# Patient Record
Sex: Male | Born: 1955
Health system: Southern US, Community
[De-identification: ages and names within clinical notes are randomized; demographics above are authoritative.]

## PROBLEM LIST (undated history)

## (undated) DIAGNOSIS — I10 Essential (primary) hypertension: Secondary | ICD-10-CM

## (undated) DIAGNOSIS — J449 Chronic obstructive pulmonary disease, unspecified: Secondary | ICD-10-CM

## (undated) DIAGNOSIS — G4733 Obstructive sleep apnea (adult) (pediatric): Secondary | ICD-10-CM

## (undated) DIAGNOSIS — H269 Unspecified cataract: Secondary | ICD-10-CM

## (undated) DIAGNOSIS — I4892 Unspecified atrial flutter: Secondary | ICD-10-CM

## (undated) HISTORY — PX: IR FL GUIDED LOC OF NEEDLE/CATH TIP FOR SPINAL INJECTION LT: IMG2396

## (undated) HISTORY — DX: Unspecified atrial flutter: I48.92

## (undated) HISTORY — DX: Obstructive sleep apnea (adult) (pediatric): G47.33

## (undated) HISTORY — PX: CARPAL TUNNEL RELEASE: SHX101

## (undated) HISTORY — DX: Essential (primary) hypertension: I10

## (undated) HISTORY — DX: Unspecified cataract: H26.9

## (undated) HISTORY — PX: EYE SURGERY: SHX253

## (undated) HISTORY — PX: HEEL SPUR SURGERY: SHX665

## (undated) HISTORY — DX: Chronic obstructive pulmonary disease, unspecified: J44.9

---

## 1992-07-18 HISTORY — PX: ORBITAL RECONSTRUCTION: SHX2115

## 1998-07-18 HISTORY — PX: OTHER SURGICAL HISTORY: SHX169

## 2011-07-19 HISTORY — PX: OTHER SURGICAL HISTORY: SHX169

## 2012-01-31 DIAGNOSIS — I8393 Asymptomatic varicose veins of bilateral lower extremities: Secondary | ICD-10-CM | POA: Insufficient documentation

## 2014-07-24 DIAGNOSIS — E785 Hyperlipidemia, unspecified: Secondary | ICD-10-CM | POA: Insufficient documentation

## 2016-07-18 HISTORY — PX: ROTATOR CUFF REPAIR: SHX139

## 2017-04-06 DIAGNOSIS — M48061 Spinal stenosis, lumbar region without neurogenic claudication: Secondary | ICD-10-CM | POA: Insufficient documentation

## 2018-10-24 DIAGNOSIS — J449 Chronic obstructive pulmonary disease, unspecified: Secondary | ICD-10-CM | POA: Insufficient documentation

## 2019-12-30 ENCOUNTER — Encounter: Payer: Self-pay | Admitting: Physician Assistant

## 2019-12-30 ENCOUNTER — Other Ambulatory Visit: Payer: Self-pay

## 2019-12-30 ENCOUNTER — Ambulatory Visit (INDEPENDENT_AMBULATORY_CARE_PROVIDER_SITE_OTHER): Payer: 59 | Admitting: Physician Assistant

## 2019-12-30 VITALS — BP 146/82 | HR 80 | Temp 98.6°F | Ht 73.0 in | Wt 233.2 lb

## 2019-12-30 DIAGNOSIS — E785 Hyperlipidemia, unspecified: Secondary | ICD-10-CM | POA: Diagnosis not present

## 2019-12-30 DIAGNOSIS — Z125 Encounter for screening for malignant neoplasm of prostate: Secondary | ICD-10-CM

## 2019-12-30 DIAGNOSIS — I10 Essential (primary) hypertension: Secondary | ICD-10-CM | POA: Diagnosis not present

## 2019-12-30 DIAGNOSIS — Z0001 Encounter for general adult medical examination with abnormal findings: Secondary | ICD-10-CM | POA: Diagnosis not present

## 2019-12-30 DIAGNOSIS — M48061 Spinal stenosis, lumbar region without neurogenic claudication: Secondary | ICD-10-CM | POA: Diagnosis not present

## 2019-12-30 DIAGNOSIS — Z1159 Encounter for screening for other viral diseases: Secondary | ICD-10-CM

## 2019-12-30 DIAGNOSIS — J42 Unspecified chronic bronchitis: Secondary | ICD-10-CM

## 2019-12-30 DIAGNOSIS — Z0000 Encounter for general adult medical examination without abnormal findings: Secondary | ICD-10-CM

## 2019-12-30 LAB — COMPREHENSIVE METABOLIC PANEL
ALT: 24 U/L (ref 0–53)
AST: 25 U/L (ref 0–37)
Albumin: 4.6 g/dL (ref 3.5–5.2)
Alkaline Phosphatase: 55 U/L (ref 39–117)
BUN: 18 mg/dL (ref 6–23)
CO2: 27 mEq/L (ref 19–32)
Calcium: 9.3 mg/dL (ref 8.4–10.5)
Chloride: 103 mEq/L (ref 96–112)
Creatinine, Ser: 0.95 mg/dL (ref 0.40–1.50)
GFR: 79.71 mL/min (ref >60.00)
Glucose, Bld: 90 mg/dL (ref 70–99)
Potassium: 4.6 mEq/L (ref 3.5–5.1)
Sodium: 139 mEq/L (ref 135–145)
Total Bilirubin: 0.9 mg/dL (ref 0.2–1.2)
Total Protein: 7.2 g/dL (ref 6.0–8.3)

## 2019-12-30 LAB — CBC WITH DIFFERENTIAL/PLATELET
Basophils Absolute: 0.1 10*3/uL (ref 0.0–0.1)
Basophils Relative: 1.2 % (ref 0.0–3.0)
Eosinophils Absolute: 0.1 10*3/uL (ref 0.0–0.7)
Eosinophils Relative: 1.6 % (ref 0.0–5.0)
HCT: 42.4 % (ref 39.0–52.0)
Hemoglobin: 14.9 g/dL (ref 13.0–17.0)
Lymphocytes Relative: 19.4 % (ref 12.0–46.0)
Lymphs Abs: 1 10*3/uL (ref 0.7–4.0)
MCHC: 35 g/dL (ref 30.0–36.0)
MCV: 95.7 fl (ref 78.0–100.0)
Monocytes Absolute: 0.6 10*3/uL (ref 0.1–1.0)
Monocytes Relative: 11.5 % (ref 3.0–12.0)
Neutro Abs: 3.4 10*3/uL (ref 1.4–7.7)
Neutrophils Relative %: 66.3 % (ref 43.0–77.0)
Platelets: 188 10*3/uL (ref 150.0–400.0)
RBC: 4.44 Mil/uL (ref 4.22–5.81)
RDW: 13.3 % (ref 11.5–15.5)
WBC: 5.1 10*3/uL (ref 4.0–10.5)

## 2019-12-30 LAB — LIPID PANEL
Cholesterol: 168 mg/dL (ref 0–200)
HDL: 69 mg/dL (ref >39.00)
LDL Cholesterol: 86 mg/dL (ref 0–99)
NonHDL: 99.28
Total CHOL/HDL Ratio: 2
Triglycerides: 65 mg/dL (ref 0.0–149.0)
VLDL: 13 mg/dL (ref 0.0–40.0)

## 2019-12-30 LAB — TSH: TSH: 1.06 u[IU]/mL (ref 0.35–4.50)

## 2019-12-30 LAB — PSA: PSA: 1.7 ng/mL (ref 0.10–4.00)

## 2019-12-30 LAB — HEMOGLOBIN A1C: Hgb A1c MFr Bld: 5.5 % (ref 4.6–6.5)

## 2019-12-30 MED ORDER — SULINDAC 150 MG PO TABS: 150.0000 mg | ORAL_TABLET | Freq: Every day | ORAL | 0 refills | Status: DC

## 2019-12-30 NOTE — Patient Instructions (Signed)
Please go to the lab for blood work.   Our office will call you with your results unless you have chosen to receive results via MyChart.  If your blood work is normal we will follow-up each year for physicals and as scheduled for chronic medical problems.  If anything is abnormal we will treat accordingly and get you in for a follow-up.  Switch the Naproxen to the Sulindac once daily. Keep up with chiropractor appointments. If not improving further I will want to get you in with a local neurosurgeon. Follow-up in 3-4 weeks for reassessment.    Preventive Care 16-28 Years Old, Male Preventive care refers to lifestyle choices and visits with your health care provider that can promote health and wellness. This includes:  A yearly physical exam. This is also called an annual well check.  Regular dental and eye exams.  Immunizations.  Screening for certain conditions.  Healthy lifestyle choices, such as eating a healthy diet, getting regular exercise, not using drugs or products that contain nicotine and tobacco, and limiting alcohol use. What can I expect for my preventive care visit? Physical exam Your health care provider will check:  Height and weight. These may be used to calculate body mass index (BMI), which is a measurement that tells if you are at a healthy weight.  Heart rate and blood pressure.  Your skin for abnormal spots. Counseling Your health care provider may ask you questions about:  Alcohol, tobacco, and drug use.  Emotional well-being.  Home and relationship well-being.  Sexual activity.  Eating habits.  Work and work Astronomer. What immunizations do I need?  Influenza (flu) vaccine  This is recommended every year. Tetanus, diphtheria, and pertussis (Tdap) vaccine  You may need a Td booster every 10 years. Varicella (chickenpox) vaccine  You may need this vaccine if you have not already been vaccinated. Zoster (shingles) vaccine  You may  need this after age 35. Measles, mumps, and rubella (MMR) vaccine  You may need at least one dose of MMR if you were born in 1957 or later. You may also need a second dose. Pneumococcal conjugate (PCV13) vaccine  You may need this if you have certain conditions and were not previously vaccinated. Pneumococcal polysaccharide (PPSV23) vaccine  You may need one or two doses if you smoke cigarettes or if you have certain conditions. Meningococcal conjugate (MenACWY) vaccine  You may need this if you have certain conditions. Hepatitis A vaccine  You may need this if you have certain conditions or if you travel or work in places where you may be exposed to hepatitis A. Hepatitis B vaccine  You may need this if you have certain conditions or if you travel or work in places where you may be exposed to hepatitis B. Haemophilus influenzae type b (Hib) vaccine  You may need this if you have certain risk factors. Human papillomavirus (HPV) vaccine  If recommended by your health care provider, you may need three doses over 6 months. You may receive vaccines as individual doses or as more than one vaccine together in one shot (combination vaccines). Talk with your health care provider about the risks and benefits of combination vaccines. What tests do I need? Blood tests  Lipid and cholesterol levels. These may be checked every 5 years, or more frequently if you are over 41 years old.  Hepatitis C test.  Hepatitis B test. Screening  Lung cancer screening. You may have this screening every year starting at age 23 if  you have a 30-pack-year history of smoking and currently smoke or have quit within the past 15 years.  Prostate cancer screening. Recommendations will vary depending on your family history and other risks.  Colorectal cancer screening. All adults should have this screening starting at age 76 and continuing until age 37. Your health care provider may recommend screening at age 29  if you are at increased risk. You will have tests every 1-10 years, depending on your results and the type of screening test.  Diabetes screening. This is done by checking your blood sugar (glucose) after you have not eaten for a while (fasting). You may have this done every 1-3 years.  Sexually transmitted disease (STD) testing. Follow these instructions at home: Eating and drinking  Eat a diet that includes fresh fruits and vegetables, whole grains, lean protein, and low-fat dairy products.  Take vitamin and mineral supplements as recommended by your health care provider.  Do not drink alcohol if your health care provider tells you not to drink.  If you drink alcohol: ? Limit how much you have to 0-2 drinks a day. ? Be aware of how much alcohol is in your drink. In the [place] one drink equals one 12 oz bottle of beer (355 mL), one 5 oz glass of wine (148 mL), or one 1 oz glass of hard liquor (44 mL). Lifestyle  Take daily care of your teeth and gums.  Stay active. Exercise for at least 30 minutes on 5 or more days each week.  Do not use any products that contain nicotine or tobacco, such as cigarettes, e-cigarettes, and chewing tobacco. If you need help quitting, ask your health care provider.  If you are sexually active, practice safe sex. Use a condom or other form of protection to prevent STIs (sexually transmitted infections).  Talk with your health care provider about taking a low-dose aspirin every day starting at age 52. What's next?  Go to your health care provider once a year for a well check visit.  Ask your health care provider how often you should have your eyes and teeth checked.  Stay up to date on all vaccines. This information is not intended to replace advice given to you by your health care provider. Make sure you discuss any questions you have with your health care provider. Document Revised: 06/28/2018 Document Reviewed: 06/28/2018 Elsevier Patient Education   2020 [employer].

## 2019-12-30 NOTE — Progress Notes (Signed)
Patient presents to clinic today to establish care.  Patient also requesting CPE.  Hypertension -- patient endorses longstanding history of hypertension.  Denies history of CVA or MI.  Denies family history of coronary artery disease.  Patient currently on a regimen of amlodipine 5 mg daily and losartan 100 mg daily.  Endorses taking both medications as directed and tolerating well.  Is really working on diet trying to keep it well-balanced with portion control. Patient denies chest pain, palpitations, lightheadedness, dizziness, vision changes or frequent headaches.  Endorses checking blood pressure at home with normotensive values.  States he does get a little anxious when coming to the doctor.  Patient endorses chronic snoring and some daytime sleepiness.  Denies any history of reported apneic episodes.  He notes both of his children have sleep apnea.  He would like to be tested.  BP Readings from Last 3 Encounters:  12/30/19 (!) 146/82   Body mass index is 30.77 kg/m.   Hyperlipidemia --patient does endorse history of hyperlipidemia, previously on Pravachol 40 mg daily.  Has been off of medication for some time noting cholesterol improved with change in lifestyle.  Does take over-the-counter supplements to help his cholesterol, including omega-3 fish oils.  GERD --patient endorses longstanding history of acid reflux despite dietary interventions and weight loss.  Is on a regimen of omeprazole 40 mg daily.  Endorses symptoms have been extremely well controlled on this regimen for some time.  Will have symptoms if he skips a dose.  Lumbar Spondylosis with Chronic Lumbago --has been followed by primary care, orthopedics and neurosurgery in the past.  Is status post an injection to the lower spine, noting these only alleviated symptoms for a couple of days.  Most recently seeing a chiropractor since moving to the area.  Is getting adjustments 3 times weekly which seem to be helping.  Does take  naproxen on occasion but states this does not really help anymore when he takes.  Has been on sulindac previously with more improvement in symptoms.  Would like to discuss potentially restarting on a as needed basis.  Health Maintenance: Immunizations --patient endorses up-to-date.  Will obtain records for review. Colonoscopy --patient endorses up-to-date.  Will review records.  Past Medical History:  Diagnosis Date  . Cataract   . COPD (chronic obstructive pulmonary disease) ([place])      The histories are not reviewed yet. Please review them in the "History" navigator section and refresh this [place].  Current Outpatient Medications on File Prior to Visit  Medication Sig Dispense Refill  . albuterol (VENTOLIN HFA) 108 (90 Base) MCG/ACT inhaler Inhale 1 puff into the lungs every 6 (six) hours as needed for wheezing or shortness of breath.    Marland Kitchen amLODipine (NORVASC) 5 MG tablet Take 5 mg by mouth daily.    . cyanocobalamin 100 MCG tablet Take 100 mcg by mouth daily.    . Ergocalciferol (VITAMIN D2 PO) Take by mouth.    . losartan (COZAAR) 100 MG tablet Take 100 mg by mouth daily.    . Multiple Vitamin (MULTIVITAMIN) capsule Take 1 capsule by mouth daily.    . Omega-3 Fatty Acids (FISH OIL) 1000 MG CAPS Take by mouth.    Marland Kitchen omeprazole (PRILOSEC) 40 MG capsule Take 40 mg by mouth daily.    . Zinc 50 MG CAPS Take by mouth.     No current facility-administered medications on file prior to visit.    Allergies  Allergen Reactions  . Sulfa Antibiotics Nausea  And Vomiting    Other reaction(s): Unknown Reaction Other reaction(s): Unknown Reaction   . Sulfur     Codine     No family history on file.  Social History   Socioeconomic History  . Marital status: Divorced    Spouse name: Not on file  . Number of children: Not on file  . Years of education: Not on file  . Highest education level: Not on file  Occupational History  . Not on file  Tobacco Use  . Smoking status: Former  Smoker    Types: Cigarettes  . Smokeless tobacco: Never Used  Vaping Use  . Vaping Use: Some days  . Devices: Juul  Substance and Sexual Activity  . Alcohol use: Yes    Alcohol/week: 28.0 standard drinks    Types: 14 Glasses of wine, 14 Cans of beer per week  . Drug use: Not Currently    Types: Marijuana  . Sexual activity: Yes    Birth control/protection: None  Other Topics Concern  . Not on file  Social History Narrative  . Not on file   Social Determinants of Health   Financial Resource Strain:   . Difficulty of Paying Living Expenses:   Food Insecurity:   . Worried About Charity fundraiser in the Last Year:   . Arboriculturist in the Last Year:   Transportation Needs:   . Film/video editor (Medical):   Marland Kitchen Lack of Transportation (Non-Medical):   Physical Activity:   . Days of Exercise per Week:   . Minutes of Exercise per Session:   Stress:   . Feeling of Stress :   Social Connections:   . Frequency of Communication with Friends and Family:   . Frequency of Social Gatherings with Friends and Family:   . Attends Religious Services:   . Active Member of Clubs or Organizations:   . Attends Archivist Meetings:   Marland Kitchen Marital Status:   Intimate Partner Violence:   . Fear of Current or Ex-Partner:   . Emotionally Abused:   Marland Kitchen Physically Abused:   . Sexually Abused:    ROS  Pertinent ROS are listed in the HPI  BP (!) 146/82   Pulse 80   Temp 98.6 F (37 C)   Ht 6\' 1"  (1.854 m)   Wt 233 lb 3.2 oz (105.8 kg)   SpO2 99%   BMI 30.77 kg/m   Physical Exam Vitals reviewed.  Constitutional:      General: He is not in acute distress.    Appearance: Normal appearance. He is well-developed. He is not diaphoretic.  HENT:     Head: Normocephalic and atraumatic.     Right Ear: Tympanic membrane, ear canal and external ear normal.     Left Ear: Tympanic membrane, ear canal and external ear normal.     Nose: Nose normal.     Mouth/Throat:     Pharynx: No  posterior oropharyngeal erythema.  Eyes:     Conjunctiva/sclera: Conjunctivae normal.     Pupils: Pupils are equal, round, and reactive to light.  Neck:     Thyroid: No thyromegaly.  Cardiovascular:     Rate and Rhythm: Normal rate and regular rhythm.     Heart sounds: Normal heart sounds.  Pulmonary:     Effort: Pulmonary effort is normal. No respiratory distress.     Breath sounds: Normal breath sounds. No wheezing or rales.  Chest:     Chest wall: No tenderness.  Abdominal:     General: Bowel sounds are normal. There is no distension.     Palpations: Abdomen is soft. There is no mass.     Tenderness: There is no abdominal tenderness. There is no guarding or rebound.  Musculoskeletal:     Cervical back: Neck supple.  Lymphadenopathy:     Cervical: No cervical adenopathy.  Skin:    General: Skin is warm and dry.     Findings: No rash.  Neurological:     Mental Status: He is alert and oriented to person, place, and time.     Cranial Nerves: No cranial nerve deficit.    Assessment/Plan: 1. Visit for preventive health examination Depression screen negative. Health Maintenance reviewed. Preventive schedule discussed and handout given in AVS. Will obtain fasting labs today. - CBC with Differential/Platelet - Comprehensive metabolic panel - Lipid panel - TSH  2. Prostate cancer screening Asymptomatic. Average risk.  He indicates understanding of the limitations of this screening test and wishes to proceed with screening PSA testing.  - PSA  3. Need for hepatitis C screening test Due based on age and CDC recommendations. - Hepatitis C Antibody  4. Benign essential hypertension BP slightly above goal today. Home BP in normotensive range. Will have him continue current regimen for now. Start DASH diet. Labs today. Will have him continue to monitor at home. Recheck here in office at follow-up visit. If still elevated may need to adjust regimen.  - Comprehensive metabolic  panel - Hemoglobin A1c - Lipid panel  5. Dyslipidemia Repeat fasting lipids today. Dietary and exercise recommendations reviewed. - Comprehensive metabolic panel - Hemoglobin A1c - Lipid panel  6. Chronic bronchitis, unspecified chronic bronchitis type ([place]) Stable. Continue current regimen. Declines specialist referral.  7. Foraminal stenosis of lumbar region Continue follow-up with chiropractor. Will give Rx for Sulindac to use PRN. Discussed it can effect BP and reflux so Celebrex was offered but he stated he cannot tolerate well. Will monitor closely. Follow-up scheduled for 3-4 weeks.    This visit occurred during the SARS-CoV-2 public health emergency.  Safety protocols were in place, including screening questions prior to the visit, additional usage of staff PPE, and extensive cleaning of exam room while observing appropriate contact time as indicated for disinfecting solutions.     Leeanne Rio, PA-C

## 2019-12-31 ENCOUNTER — Telehealth: Payer: Self-pay

## 2019-12-31 LAB — HEPATITIS C ANTIBODY
Hepatitis C Ab: NONREACTIVE
SIGNAL TO CUT-OFF: 0.01 (ref <1.00)

## 2019-12-31 NOTE — Telephone Encounter (Signed)
Patient to make a 2 week follow up nurse visit for blood pressure recheck.

## 2019-12-31 NOTE — Telephone Encounter (Signed)
Patient called and scheduled appt for 06/28

## 2020-01-09 ENCOUNTER — Telehealth: Payer: Self-pay | Admitting: Physician Assistant

## 2020-01-09 NOTE — Telephone Encounter (Signed)
Patient is coming in Tuesday for a blood pressure check - He just wants you to know that he has been checking it twice a day since he was here.  He said that is staying 150-160/90-100.

## 2020-01-14 ENCOUNTER — Ambulatory Visit: Payer: 59

## 2020-01-14 ENCOUNTER — Encounter: Payer: Self-pay | Admitting: Physician Assistant

## 2020-01-14 ENCOUNTER — Other Ambulatory Visit: Payer: Self-pay

## 2020-01-14 VITALS — BP 110/68

## 2020-01-14 DIAGNOSIS — M1990 Unspecified osteoarthritis, unspecified site: Secondary | ICD-10-CM | POA: Insufficient documentation

## 2020-01-14 DIAGNOSIS — I10 Essential (primary) hypertension: Secondary | ICD-10-CM

## 2020-01-14 NOTE — Progress Notes (Signed)
Scott Montoya, 64 year old male presents to the office for a blood pressure check. He states his blood pressure reading at home have been consistently ranging from 307-460 being the systolic and 90 - 029 as the diastolic. Patient states he takes it twice a day, about 6:30 am and about 7pm at night. Patient's blood pressure was taken today after about 10 - 15 minutes of rest in the left arm. Blood pressure was 110/68.  Fritz Pickerel, LPN

## 2020-01-15 NOTE — Progress Notes (Signed)
LPN note Reviewed. BP today in office is normotensive. I question if his home cuff is inaccurate. Would recommend he either (1) get a new arm cuff or (2) bring in current cuff so we can check alongside manual check to assess accuracy. Also he needs to not take BP until an hour after he has taken his daily medication. Should check after he has been sitting for 10 minutes with feet flat on floor and in a non-stressful environment. Recommend he work on checking like this over the next week and then call with numbers.

## 2020-01-15 NOTE — Progress Notes (Signed)
Left a detailed message informing the patient of PCP's recommendations as well as instructed patient to call the office with bp readings.

## 2020-01-24 ENCOUNTER — Telehealth: Payer: Self-pay | Admitting: Physician Assistant

## 2020-01-24 DIAGNOSIS — I10 Essential (primary) hypertension: Secondary | ICD-10-CM

## 2020-01-24 MED ORDER — AMLODIPINE BESYLATE 10 MG PO TABS: 10.0000 mg | ORAL_TABLET | Freq: Every day | ORAL | 3 refills | Status: DC

## 2020-01-24 NOTE — Telephone Encounter (Signed)
BP READINGS:  -7/4 - 7/7: about 150/100 - 160/100 --had other family take BP to which ended up being the same. On Tuesday got a new meter thinking the old one was the issue but BP was still ranging like normal.  -7/9: 140/96 -got up earlier to eat, took medicine then fell asleep and took it as soon as he got up

## 2020-01-24 NOTE — Telephone Encounter (Signed)
Advised patient of PCP recommendations. Continue the Losartan 100 mg, increase Amlodipine from 5 mg to 10 mg daily and keep check of blood pressures and follow up in 2 weeks. He is agreeable. He will take 2 of the 5 mg while he is traveling.

## 2020-01-24 NOTE — Telephone Encounter (Signed)
Please advise 

## 2020-01-24 NOTE — Telephone Encounter (Signed)
Thank you for the update of readings.  BP still above goal.  Would continue current dose of losartan daily at 100 mg.  Increase amlodipine to 10 mg daily.  Okay to send a new Rx.  Continue checking blood pressure daily and recording.  Follow-up in office in 2 weeks.  Sooner if needed.

## 2020-01-24 NOTE — Addendum Note (Signed)
Addended by: Leonidas Romberg on: 01/24/2020 04:25 PM   Modules accepted: Orders

## 2020-01-25 ENCOUNTER — Other Ambulatory Visit: Payer: Self-pay | Admitting: Physician Assistant

## 2020-01-27 ENCOUNTER — Other Ambulatory Visit: Payer: Self-pay | Admitting: Emergency Medicine

## 2020-01-27 DIAGNOSIS — K219 Gastro-esophageal reflux disease without esophagitis: Secondary | ICD-10-CM

## 2020-01-27 MED ORDER — OMEPRAZOLE 40 MG PO CPDR: 40.0000 mg | DELAYED_RELEASE_CAPSULE | Freq: Every day | ORAL | 1 refills | Status: DC

## 2020-01-27 NOTE — Telephone Encounter (Signed)
Ok for refill? 

## 2020-02-12 ENCOUNTER — Telehealth: Payer: Self-pay | Admitting: Physician Assistant

## 2020-02-12 DIAGNOSIS — K219 Gastro-esophageal reflux disease without esophagitis: Secondary | ICD-10-CM

## 2020-02-12 DIAGNOSIS — I10 Essential (primary) hypertension: Secondary | ICD-10-CM

## 2020-02-12 DIAGNOSIS — M5416 Radiculopathy, lumbar region: Secondary | ICD-10-CM

## 2020-02-12 NOTE — Telephone Encounter (Signed)
Patient was seen on 12/30/19 for CPE and Lumbar Spondylosis with Chronic Lumbago was discussed. Sulindac was prescribed. Now patient is having numbness in leg for a month now. Patient does not want to come in. Who can we refer to for further evaluation?

## 2020-02-12 NOTE — Telephone Encounter (Signed)
Patient called and said that he has been experiencing numbness in his leg for the past month.  He would like to know who he could be referred to for this.  He doesn't want to come in for a referral.  He think's his insurance doesn't require one.  Please advise

## 2020-02-13 MED ORDER — LOSARTAN POTASSIUM 100 MG PO TABS: 100.0000 mg | ORAL_TABLET | Freq: Every day | ORAL | 1 refills | Status: DC

## 2020-02-13 MED ORDER — AMLODIPINE BESYLATE 10 MG PO TABS: 10.0000 mg | ORAL_TABLET | Freq: Every day | ORAL | 1 refills | Status: DC

## 2020-02-13 NOTE — Telephone Encounter (Signed)
Spoke with patient of getting xray of the lower back due to worsening of numbness and referral to Neurosurgery. He is agreeable. He denies numbness of groin area or bowel changes or habits. He states he has had some numbness or radiation of pain infrequently but has become regular and constant Referral placed for Neurosurgery and xray of lumbar.

## 2020-02-13 NOTE — Addendum Note (Signed)
Addended by: Leonidas Romberg on: 02/13/2020 01:04 PM   Modules accepted: Orders

## 2020-02-13 NOTE — Telephone Encounter (Signed)
Can refer to Neurosurgery for lumbar radiculopathy. If things are worsening in the interim we would need to reassess and try to proceed with further imaging and treatment in the meantime. If any new onset numbness in the groin, new onset weakness in the extremities or significant change in bowel or bladder habits, needs to seek care in the ER as this is concerning for more significant nerve compression.

## 2020-02-13 NOTE — Telephone Encounter (Signed)
Also on review -- he has not had any plain films on file either. It is more than reasonable to get x-ray for initial assessment as typically if MRI is warranted, insurance will not cover until plain films have been obtained.

## 2020-02-14 ENCOUNTER — Telehealth: Payer: Self-pay | Admitting: Physician Assistant

## 2020-02-14 NOTE — Telephone Encounter (Signed)
Updated patient chart with his 2nd covid vaccine

## 2020-02-14 NOTE — Telephone Encounter (Signed)
Patient needs to talk to you about his second covid vaccine and his medical records from [place].

## 2020-02-14 NOTE — Telephone Encounter (Signed)
Pt called in wanting to make sure that we are aware the he has Covid in Jan of 2021 and that he had his 2nd Moderna covid vac. On 11/25/2019   He states that his chart doesn't show this

## 2020-03-04 ENCOUNTER — Telehealth: Payer: Self-pay | Admitting: Physician Assistant

## 2020-03-05 NOTE — Telephone Encounter (Signed)
Pt called saying [facility] called him stating we denied his refill request. Pt is asking for a 90 day supply. Please advise.

## 2020-03-05 NOTE — Telephone Encounter (Signed)
FYI. Pt in ED.  ?

## 2020-03-05 NOTE — Telephone Encounter (Signed)
He is overdue for follow-up after we started the medication. Needs follow-up before further refills will be given.

## 2020-03-06 NOTE — Telephone Encounter (Signed)
Patient called back stating he will continue naproxen until his appt on 8/26.

## 2020-03-12 ENCOUNTER — Ambulatory Visit (INDEPENDENT_AMBULATORY_CARE_PROVIDER_SITE_OTHER): Payer: 59 | Admitting: Physician Assistant

## 2020-03-12 ENCOUNTER — Encounter: Payer: Self-pay | Admitting: Physician Assistant

## 2020-03-12 ENCOUNTER — Other Ambulatory Visit: Payer: Self-pay

## 2020-03-12 VITALS — BP 138/82 | HR 82 | Temp 98.6°F | Resp 16 | Ht 73.0 in | Wt 240.0 lb

## 2020-03-12 DIAGNOSIS — I10 Essential (primary) hypertension: Secondary | ICD-10-CM | POA: Diagnosis not present

## 2020-03-12 DIAGNOSIS — Z4802 Encounter for removal of sutures: Secondary | ICD-10-CM | POA: Diagnosis not present

## 2020-03-12 DIAGNOSIS — G4750 Parasomnia, unspecified: Secondary | ICD-10-CM | POA: Diagnosis not present

## 2020-03-12 DIAGNOSIS — R0683 Snoring: Secondary | ICD-10-CM

## 2020-03-12 MED ORDER — SULINDAC 150 MG PO TABS: 150.0000 mg | ORAL_TABLET | Freq: Every day | ORAL | 1 refills | Status: DC

## 2020-03-12 MED ORDER — AMLODIPINE BESYLATE 10 MG PO TABS: 10.0000 mg | ORAL_TABLET | Freq: Every day | ORAL | 1 refills | Status: DC

## 2020-03-12 MED ORDER — LOSARTAN POTASSIUM 100 MG PO TABS: 100.0000 mg | ORAL_TABLET | Freq: Every day | ORAL | 1 refills | Status: DC

## 2020-03-12 NOTE — Progress Notes (Signed)
Patient presents to clinic today for suture removal. Patient presented to the emergency room on 03/05/2020 after waking up to find he had sustained a laceration above his left eye. Some alcohol use the night before but patient states this was nothing significant. Remembers going to sleep but has no recollection of any trauma that would have caused the laceration. ER work-up included CT head which was unremarkable. Laceration was cleaned and repaired with placement of 7 simple, interrupted sutures. Patient was instructed to follow-up with primary care in a week for suture removal.  Patient denies any tenderness, redness, drainage or warmth from wound. Has been keeping clean and dry. Denies any headaches or vision changes. Denies fever, chills, malaise or fatigue. In regards to how this incident occurred he still has no idea. Does fully remember going to sleep, denying any possibility of syncope. Does note he snores very loudly and has questionable prior apneic episodes. Is requesting sleep study.  Past Medical History:  Diagnosis Date  . Cataract   . COPD (chronic obstructive pulmonary disease) ([place])     Current Outpatient Medications on File Prior to Visit  Medication Sig Dispense Refill  . albuterol (VENTOLIN HFA) 108 (90 Base) MCG/ACT inhaler Inhale 1 puff into the lungs every 6 (six) hours as needed for wheezing or shortness of breath.    Marland Kitchen amLODipine (NORVASC) 10 MG tablet Take 1 tablet (10 mg total) by mouth daily. 90 tablet 1  . cyanocobalamin 100 MCG tablet Take 100 mcg by mouth daily.    . ergocalciferol (DRISDOL) 200 MCG/ML drops Take by mouth.    . losartan (COZAAR) 100 MG tablet Take 1 tablet (100 mg total) by mouth daily. 90 tablet 1  . Multiple Vitamin (MULTIVITAMIN) capsule Take 1 capsule by mouth daily.    . Omega-3 Fatty Acids (FISH OIL) 1000 MG CAPS Take by mouth.    Marland Kitchen omeprazole (PRILOSEC) 40 MG capsule Take 1 capsule (40 mg total) by mouth daily. 90 capsule 1  . sulindac  (CLINORIL) 150 MG tablet TAKE ONE TABLET BY MOUTH ONE TIME DAILY 30 tablet 0  . Zinc 50 MG CAPS Take by mouth.     No current facility-administered medications on file prior to visit.    Allergies  Allergen Reactions  . Codeine     Other reaction(s): dry mouth  . Sulfa Antibiotics Nausea And Vomiting    Other reaction(s): Unknown Reaction Other reaction(s): Unknown Reaction   . Sulfur     Codine     History reviewed. No pertinent family history.  Social History   Socioeconomic History  . Marital status: Divorced    Spouse name: Not on file  . Number of children: Not on file  . Years of education: Not on file  . Highest education level: Not on file  Occupational History  . Not on file  Tobacco Use  . Smoking status: Former Smoker    Types: Cigarettes  . Smokeless tobacco: Never Used  Vaping Use  . Vaping Use: Some days  . Devices: Juul  Substance and Sexual Activity  . Alcohol use: Yes    Alcohol/week: 28.0 standard drinks    Types: 14 Glasses of wine, 14 Cans of beer per week  . Drug use: Not Currently    Types: Marijuana  . Sexual activity: Yes    Birth control/protection: None  Other Topics Concern  . Not on file  Social History Narrative  . Not on file   Social Determinants of Health  Financial Resource Strain:   . Difficulty of Paying Living Expenses: Not on file  Food Insecurity:   . Worried About Charity fundraiser in the Last Year: Not on file  . Ran Out of Food in the Last Year: Not on file  Transportation Needs:   . Lack of Transportation (Medical): Not on file  . Lack of Transportation (Non-Medical): Not on file  Physical Activity:   . Days of Exercise per Week: Not on file  . Minutes of Exercise per Session: Not on file  Stress:   . Feeling of Stress : Not on file  Social Connections:   . Frequency of Communication with Friends and Family: Not on file  . Frequency of Social Gatherings with Friends and Family: Not on file  . Attends  Religious Services: Not on file  . Active Member of Clubs or Organizations: Not on file  . Attends Archivist Meetings: Not on file  . Marital Status: Not on file   Review of Systems - See HPI.  All other ROS are negative.  BP 138/82   Pulse 82   Temp 98.6 F (37 C) (Temporal)   Resp 16   Ht 6\' 1"  (1.854 m)   Wt 240 lb (108.9 kg)   SpO2 98%   BMI 31.66 kg/m   Physical Exam Vitals reviewed.  Constitutional:      Appearance: Normal appearance.  HENT:     Head: Normocephalic and atraumatic.   Cardiovascular:     Rate and Rhythm: Normal rate and regular rhythm.     Pulses: Normal pulses.     Heart sounds: Normal heart sounds.  Pulmonary:     Effort: Pulmonary effort is normal.  Musculoskeletal:     Cervical back: Neck supple.  Neurological:     General: No focal deficit present.     Mental Status: He is alert and oriented to person, place, and time.  Psychiatric:        Mood and Affect: Mood normal.     Recent Results (from the past 2160 hour(s))  CBC with Differential/Platelet     Status: None   Collection Time: 12/30/19 11:24 AM  Result Value Ref Range   WBC 5.1 4.0 - 10.5 K/uL   RBC 4.44 4.22 - 5.81 Mil/uL   Hemoglobin 14.9 13.0 - 17.0 g/dL   HCT 42.4 39 - 52 %   MCV 95.7 78.0 - 100.0 fl   MCHC 35.0 30.0 - 36.0 g/dL   RDW 13.3 11.5 - 15.5 %   Platelets 188.0 150 - 400 K/uL   Neutrophils Relative % 66.3 43 - 77 %   Lymphocytes Relative 19.4 12 - 46 %   Monocytes Relative 11.5 3 - 12 %   Eosinophils Relative 1.6 0 - 5 %   Basophils Relative 1.2 0 - 3 %   Neutro Abs 3.4 1.4 - 7.7 K/uL   Lymphs Abs 1.0 0.7 - 4.0 K/uL   Monocytes Absolute 0.6 0 - 1 K/uL   Eosinophils Absolute 0.1 0 - 0 K/uL   Basophils Absolute 0.1 0 - 0 K/uL  Comprehensive metabolic panel     Status: None   Collection Time: 12/30/19 11:24 AM  Result Value Ref Range   Sodium 139 135 - 145 mEq/L   Potassium 4.6 3.5 - 5.1 mEq/L   Chloride 103 96 - 112 mEq/L   CO2 27 19 - 32 mEq/L    Glucose, Bld 90 70 - 99 mg/dL   BUN  18 6 - 23 mg/dL   Creatinine, Ser 0.95 0.40 - 1.50 mg/dL   Total Bilirubin 0.9 0.2 - 1.2 mg/dL   Alkaline Phosphatase 55 39 - 117 U/L   AST 25 0 - 37 U/L   ALT 24 0 - 53 U/L   Total Protein 7.2 6.0 - 8.3 g/dL   Albumin 4.6 3.5 - 5.2 g/dL   GFR 79.71 >60.00 mL/min   Calcium 9.3 8.4 - 10.5 mg/dL  Hemoglobin A1c     Status: None   Collection Time: 12/30/19 11:24 AM  Result Value Ref Range   Hgb A1c MFr Bld 5.5 4.6 - 6.5 %    Comment: Glycemic Control Guidelines for People with Diabetes:Non Diabetic:  <6%Goal of Therapy: <7%Additional Action Suggested:  >8%   Lipid panel     Status: None   Collection Time: 12/30/19 11:24 AM  Result Value Ref Range   Cholesterol 168 0 - 200 mg/dL    Comment: ATP III Classification       Desirable:  < 200 mg/dL               Borderline High:  200 - 239 mg/dL          High:  > = 240 mg/dL   Triglycerides 65.0 0 - 149 mg/dL    Comment: Normal:  <150 mg/dLBorderline High:  150 - 199 mg/dL   HDL 69.00 >39.00 mg/dL   VLDL 13.0 0.0 - 40.0 mg/dL   LDL Cholesterol 86 0 - 99 mg/dL   Total CHOL/HDL Ratio 2     Comment:                Men          Women1/2 Average Risk     3.4          3.3Average Risk          5.0          4.42X Average Risk          9.6          7.13X Average Risk          15.0          11.0                       NonHDL 99.28     Comment: NOTE:  Non-HDL goal should be 30 mg/dL higher than patient's LDL goal (i.e. LDL goal of < 70 mg/dL, would have non-HDL goal of < 100 mg/dL)  TSH     Status: None   Collection Time: 12/30/19 11:24 AM  Result Value Ref Range   TSH 1.06 0.35 - 4.50 uIU/mL  PSA     Status: None   Collection Time: 12/30/19 11:24 AM  Result Value Ref Range   PSA 1.70 0.10 - 4.00 ng/mL    Comment: Test performed using Access Hybritech PSA Assay, a parmagnetic partical, chemiluminecent immunoassay.  Hepatitis C Antibody     Status: None   Collection Time: 12/30/19 11:24 AM  Result Value Ref  Range   Hepatitis C Ab NON-REACTIVE NON-REACTI   SIGNAL TO CUT-OFF 0.01 <1.00    Comment: . HCV antibody was non-reactive. There is no laboratory  evidence of HCV infection. . In most cases, no further action is required. However, if recent HCV exposure is suspected, a test for HCV RNA (test code [phone]) is suggested. . For additional information please refer to http://education.quest[facility].com/faq/FAQ22v1 (This link  is being provided for informational/ educational purposes only.) .    Assessment/Plan: 1. Visit for suture removal Removed without complication with sterile scissors. Tolerated well. Good wound healing. Home care instructions reviewed with patient.   2. Benign essential hypertension Medications refilled.  - amLODipine (NORVASC) 10 MG tablet; Take 1 tablet (10 mg total) by mouth daily.  Dispense: 90 tablet; Refill: 1 - losartan (COZAAR) 100 MG tablet; Take 1 tablet (100 mg total) by mouth daily.  Dispense: 90 tablet; Refill: 1  3. Snoring 4. Parasomnia, unspecified type Question sleep apnea giving snoring and potential apneic episodes at night. Also with recent injury with no recollection of occurrence, happening after he went to bed. Referral to [facility] for sleep study, etc.  - Ambulatory referral to Pulmonology  This visit occurred during the SARS-CoV-2 public health emergency.  Safety protocols were in place, including screening questions prior to the visit, additional usage of staff PPE, and extensive cleaning of exam room while observing appropriate contact time as indicated for disinfecting solutions.     Leeanne Rio, PA-C

## 2020-03-12 NOTE — Patient Instructions (Signed)
Please keep well-hydrated. Continue chronic medications as directed.   If you note any new onset redness, tenderness, drainage or site from the area above eye, let us know ASAP. The area looks very well healed today.  You will be contacted by Sleep Medicine for a consult regarding recent issues and setting up a sleep study.

## 2020-04-21 ENCOUNTER — Other Ambulatory Visit: Payer: Self-pay

## 2020-04-21 ENCOUNTER — Encounter: Payer: Self-pay | Admitting: Pulmonary Disease

## 2020-04-21 ENCOUNTER — Ambulatory Visit (INDEPENDENT_AMBULATORY_CARE_PROVIDER_SITE_OTHER): Payer: 59 | Admitting: Pulmonary Disease

## 2020-04-21 VITALS — BP 130/84 | HR 100 | Temp 97.7°F | Ht 73.0 in | Wt 238.6 lb

## 2020-04-21 DIAGNOSIS — Z23 Encounter for immunization: Secondary | ICD-10-CM | POA: Diagnosis not present

## 2020-04-21 DIAGNOSIS — J432 Centrilobular emphysema: Secondary | ICD-10-CM

## 2020-04-21 DIAGNOSIS — G4733 Obstructive sleep apnea (adult) (pediatric): Secondary | ICD-10-CM | POA: Diagnosis not present

## 2020-04-21 NOTE — Assessment & Plan Note (Signed)
Parasomnia in this age group is generally related to sleep disordered breathing.  No bed partner history is available to corroborate. We will start with a home sleep test -if this shows significant OSA we will initiate CPAP therapy.  If he has further episodes of sleepwalking or other incidents, then we will consider an in lab attended polysomnogram.  There is no indication that this could have been a seizure and appears to be an isolated incident  Given excessive daytime somnolence, narrow pharyngeal exam, witnessed apneas & loud snoring, obstructive sleep apnea is very likely & an overnight polysomnogram will be scheduled as a home study. The pathophysiology of obstructive sleep apnea , it's cardiovascular consequences & modes of treatment including CPAP were discused with the patient in detail & they evidenced understanding.  He would be amenable to using a CPAP if needed

## 2020-04-21 NOTE — Progress Notes (Signed)
Subjective:    Patient ID: Scott Montoya, male    DOB: 1955/10/06, 64 y.o.   MRN: [id number]  HPI   64 year old presents for evaluation of sleep disordered breathing and parasomnia. He has moved from [place] to [place].  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 He reports that 2 of his kids have OSA and uses CPAP.  He reports loud snoring but no bed partner history is available, he is divorced for many years. He has chronic low back pain and sleeps in a recliner for at least the last 3 years.  He woke up 1 morning and had a laceration on his left eyebrow for which she required 7 sutures.  He does not remember how he got this injury.  No history of sleepwalking in the past or other parasomnias.  He tried an over-the-counter mouthguard without much benefit in the past. Epworth sleepiness score is 7. Bedtime is around 10 PM, sleep latency is minimal, he reports 1-3 nocturnal awakenings including nocturia and is out of bed at 6:30 AM feeling sluggish and tired with dryness of mouth.  He has gained 15 pounds in the last 2 years. There is no history suggestive of cataplexy, sleep paralysis or parasomnias   He reports right sciatica for which he has seen an acupuncturist and chiropractor.  He quit smoking 4 years ago.  Drinks a 2 ounces of vodkas a night Every day    Past Medical History:  Diagnosis Date   Cataract    COPD (chronic obstructive pulmonary disease) ([facility])    Past Surgical History:  Procedure Laterality Date   EYE SURGERY     foot heel repair  2000   IR FL GUIDED LOC OF NEEDLE/CATH TIP FOR SPINAL INJECTION LT     ORBITAL RECONSTRUCTION  1994   ROTATOR CUFF REPAIR Right 2018    Allergies  Allergen Reactions   Codeine     Other reaction(s): dry mouth   Sulfa Antibiotics Nausea And Vomiting    Other reaction(s): Unknown Reaction Other reaction(s): Unknown Reaction    Sulfur     Codine      Social History   Socioeconomic History   Marital status: Divorced    Spouse name: Not on file   Number of children: Not on file   Years of education: Not on file   Highest education level: Not on file  Occupational History   Not on file  Tobacco Use   Smoking status: Former Smoker    Types: Cigarettes   Smokeless tobacco: Never Used  Scientific laboratory technician Use: Some days   Devices: Juul  Substance and Sexual Activity   Alcohol use: Yes    Alcohol/week: 28.0 standard drinks    Types: 14 Glasses of wine, 14 Cans of beer per week   Drug use: Not Currently    Types: Marijuana   Sexual activity: Yes    Birth control/protection: None  Other Topics Concern   Not on file  Social History Narrative   Not on file   Social Determinants of Health   Financial Resource Strain:    Difficulty of Paying Living Expenses: Not on file  Food Insecurity:    Worried About Charity fundraiser in the Last Year: Not on file   [employer] of Food in the Last Year: Not on file  Transportation Needs:    Lack of Transportation (Medical): Not on file   Lack of  Transportation (Non-Medical): Not on file  Physical Activity:    Days of Exercise per Week: Not on file   Minutes of Exercise per Session: Not on file  Stress:    Feeling of Stress : Not on file  Social Connections:    Frequency of Communication with Friends and Family: Not on file   Frequency of Social Gatherings with Friends and Family: Not on file   Attends Religious Services: Not on file   Active Member of Clubs or Organizations: Not on file   Attends Archivist Meetings: Not on file   Marital Status: Not on file  Intimate Partner Violence:    Fear of Current or Ex-Partner: Not on file   Emotionally Abused: Not on file   Physically Abused: Not on file   Sexually Abused: Not on file      Review of Systems Constitutional: negative for anorexia, fevers and sweats  Eyes: negative for  irritation, redness and visual disturbance  Ears, nose, mouth, throat, and face: negative for earaches, epistaxis, nasal congestion and sore throat  Respiratory: negative for cough, dyspnea on exertion, sputum and wheezing  Cardiovascular: negative for chest pain, dyspnea, lower extremity edema, orthopnea, palpitations and syncope  Gastrointestinal: negative for abdominal pain, constipation, diarrhea, melena, nausea and vomiting  Genitourinary:negative for dysuria, frequency and hematuria  Hematologic/lymphatic: negative for bleeding, easy bruising and lymphadenopathy  Musculoskeletal:negative for arthralgias, muscle weakness and stiff joints  Neurological: negative for coordination problems, gait problems, headaches and weakness  Endocrine: negative for diabetic symptoms including polydipsia, polyuria and weight loss     Objective:   Physical Exam  Gen. Pleasant, obese, in no distress, normal affect ENT - no pallor,icterus, no post nasal drip, class 2-3 airway Neck: No JVD, no thyromegaly, no carotid bruits Lungs: no use of accessory muscles, no dullness to percussion, decreased without rales or rhonchi  Cardiovascular: Rhythm regular, heart sounds  normal, no murmurs or gallops, no peripheral edema Abdomen: soft and non-tender, no hepatosplenomegaly, BS normal. Musculoskeletal: No deformities, no cyanosis or clubbing Neuro:  alert, non focal, no tremors       Assessment & Plan:

## 2020-04-21 NOTE — Assessment & Plan Note (Signed)
Was diagnosis as noted in the problem list.  He quit smoking 4 years ago. He denies dyspnea or wheezing we will hold off further assessment for this problem

## 2020-04-21 NOTE — Patient Instructions (Signed)
Schedule home sleep test.  If this is indeterminate , then we will schedule her detailed in lab study to look for other causes of sleepwalking

## 2020-05-15 ENCOUNTER — Ambulatory Visit: Payer: 59

## 2020-05-15 ENCOUNTER — Other Ambulatory Visit: Payer: Self-pay

## 2020-05-15 DIAGNOSIS — G4733 Obstructive sleep apnea (adult) (pediatric): Secondary | ICD-10-CM

## 2020-05-22 ENCOUNTER — Other Ambulatory Visit: Payer: Self-pay

## 2020-05-22 DIAGNOSIS — G4733 Obstructive sleep apnea (adult) (pediatric): Secondary | ICD-10-CM

## 2020-05-25 DIAGNOSIS — G4733 Obstructive sleep apnea (adult) (pediatric): Secondary | ICD-10-CM

## 2020-05-26 ENCOUNTER — Telehealth: Payer: Self-pay | Admitting: Pulmonary Disease

## 2020-05-26 DIAGNOSIS — G4733 Obstructive sleep apnea (adult) (pediatric): Secondary | ICD-10-CM

## 2020-05-26 NOTE — Telephone Encounter (Signed)
HST showed mild  OSA with AHI 13/ hr Suggest CPAP titration study. This will give [place] the opportunity to fit him with a good mask has also find out if there is any cause for his sleepwalking and other issues Based on titration study, we can set him up with a CPAP OV with me/APP in 8 wks

## 2020-05-26 NOTE — Telephone Encounter (Signed)
Called and went over HST result per Dr Elsworth Soho with patient. All questions answered and patient expressed understanding. Patient agreeable to Dr Bari Mantis recommendation to order CPAP titration study. Order Placed per Dr Elsworth Soho. Scheduled office visit for 08/03/2020 at 4:15pm with NP at the [facility] office per Dr Elsworth Soho. Patient agreeable to time, date and location. Nothing further needed at this time.

## 2020-06-18 ENCOUNTER — Telehealth: Payer: Self-pay | Admitting: Pulmonary Disease

## 2020-06-18 NOTE — Telephone Encounter (Signed)
Spoke to [email or web address]  and she will send this info to the md for review and faxw results Joellen Jersey

## 2020-06-18 NOTE — Telephone Encounter (Signed)
Reason for initial consultation was parasomnia  HST showed mild  OSA with AHI 13/ hr Suggest CPAP titration study. This will give [place] the opportunity to fit him with a good mask has also find out if there is any cause for his sleepwalking and other issues

## 2020-06-18 NOTE — Telephone Encounter (Signed)
-----   Message from Joellen Jersey sent at 06/17/2020  3:58 PM EST ----- Regarding: cpap Pt's insurance would like to have pt to AUTO-CPAP first if you agree wuth this I need and order for it if not we need to prove to insurance why pt need to go to the sleep center for cpap study thanks libby

## 2020-06-19 ENCOUNTER — Ambulatory Visit (HOSPITAL_BASED_OUTPATIENT_CLINIC_OR_DEPARTMENT_OTHER): Payer: 59 | Attending: Pulmonary Disease | Admitting: Pulmonary Disease

## 2020-06-19 ENCOUNTER — Other Ambulatory Visit: Payer: Self-pay

## 2020-06-19 DIAGNOSIS — G4733 Obstructive sleep apnea (adult) (pediatric): Secondary | ICD-10-CM | POA: Diagnosis not present

## 2020-06-24 ENCOUNTER — Emergency Department (HOSPITAL_COMMUNITY)
Admission: EM | Admit: 2020-06-24 | Discharge: 2020-06-24 | Disposition: A | Discharge status: Home / Self Care | Payer: 59 | Attending: Emergency Medicine | Admitting: Emergency Medicine

## 2020-06-24 ENCOUNTER — Other Ambulatory Visit: Payer: Self-pay

## 2020-06-24 ENCOUNTER — Emergency Department (HOSPITAL_COMMUNITY): Payer: 59

## 2020-06-24 DIAGNOSIS — R112 Nausea with vomiting, unspecified: Secondary | ICD-10-CM | POA: Insufficient documentation

## 2020-06-24 DIAGNOSIS — M545 Low back pain, unspecified: Secondary | ICD-10-CM | POA: Diagnosis present

## 2020-06-24 DIAGNOSIS — J449 Chronic obstructive pulmonary disease, unspecified: Secondary | ICD-10-CM | POA: Insufficient documentation

## 2020-06-24 DIAGNOSIS — R109 Unspecified abdominal pain: Secondary | ICD-10-CM | POA: Diagnosis not present

## 2020-06-24 DIAGNOSIS — M5430 Sciatica, unspecified side: Secondary | ICD-10-CM

## 2020-06-24 DIAGNOSIS — I10 Essential (primary) hypertension: Secondary | ICD-10-CM | POA: Diagnosis not present

## 2020-06-24 DIAGNOSIS — Z87891 Personal history of nicotine dependence: Secondary | ICD-10-CM | POA: Diagnosis not present

## 2020-06-24 DIAGNOSIS — Z79899 Other long term (current) drug therapy: Secondary | ICD-10-CM | POA: Diagnosis not present

## 2020-06-24 LAB — I-STAT CHEM 8, ED
BUN: 17 mg/dL (ref 8–23)
Calcium, Ion: 1.22 mmol/L (ref 1.15–1.40)
Chloride: 101 mmol/L (ref 98–111)
Creatinine, Ser: 0.9 mg/dL (ref 0.61–1.24)
Glucose, Bld: 92 mg/dL (ref 70–99)
HCT: 45 % (ref 39.0–52.0)
Hemoglobin: 15.3 g/dL (ref 13.0–17.0)
Potassium: 4 mmol/L (ref 3.5–5.1)
Sodium: 140 mmol/L (ref 135–145)
TCO2: 27 mmol/L (ref 22–32)

## 2020-06-24 MED ORDER — HYDROCODONE-ACETAMINOPHEN 5-325 MG PO TABS
1.0000 | ORAL_TABLET | Freq: Four times a day (QID) | ORAL | 0 refills | Status: DC | PRN
Start: 2020-06-24 — End: 2020-09-02

## 2020-06-24 MED ORDER — PREDNISONE 10 MG (21) PO TBPK: ORAL_TABLET | ORAL | 0 refills | Status: AC

## 2020-06-24 MED ORDER — HYDROMORPHONE HCL 1 MG/ML IJ SOLN
1.0000 mg | Freq: Once | INTRAMUSCULAR | Status: AC
  Administered 2020-06-24: 1 mg via INTRAVENOUS
  Filled 2020-06-24: qty 1

## 2020-06-24 MED ORDER — PREDNISONE 10 MG (21) PO TBPK: ORAL_TABLET | ORAL | 0 refills | Status: DC

## 2020-06-24 MED ORDER — ONDANSETRON HCL 4 MG/2ML IJ SOLN
4.0000 mg | Freq: Once | INTRAMUSCULAR | Status: AC
  Administered 2020-06-24: 4 mg via INTRAVENOUS
  Filled 2020-06-24: qty 2

## 2020-06-24 NOTE — ED Triage Notes (Signed)
Pt here with c/o low back pain radiates down the right leg , hx of same

## 2020-06-24 NOTE — ED Provider Notes (Signed)
[facility] EMERGENCY DEPARTMENT Provider Note   CSN: [id number] Arrival date & time: 06/24/20  0901     History Back pain  Scott Montoya is a 64 y.o. male.  HPI   Patient presents to the ED with complaints of right-sided flank and back pain.  Patient states he had a sleep study over the weekend.  Patient normally sleeps on a recliner but had to sleep on a flat bed.  He thinks this may have triggered some of his back trouble.  He was initially doing fine on Monday but yesterday he started having increasing pain.  This morning the pain was more intense and severe.  He had difficulty even walking.  Patient also started having some nausea and vomiting.  He has never had this type of trouble before with his back.  The pain is sharp in his right lower back and does seem to go down towards his leg.  He is not having any focal weakness but does feel some tingling in his right leg.  Patient states he has seen a back doctor previously in October.  Patient states nothing was specifically done.  Past Medical History:  Diagnosis Date  . Cataract   . COPD (chronic obstructive pulmonary disease) [facility])     Patient Active Problem List   Diagnosis Date Noted  . OSA (obstructive sleep apnea) 04/21/2020  . Osteoarthritis 01/14/2020  . Chronic obstructive pulmonary disease ([place]) 10/24/2018  . Foraminal stenosis of lumbar region 04/06/2017  . Dyslipidemia 07/24/2014  . Asymptomatic varicose veins of bilateral lower extremities 01/31/2012  . Benign essential hypertension 01/31/2012  . Hx of smoking 01/31/2012    Past Surgical History:  Procedure Laterality Date  . EYE SURGERY    . foot heel repair  2000  . IR FL GUIDED LOC OF NEEDLE/CATH TIP FOR SPINAL INJECTION LT    . ORBITAL RECONSTRUCTION  1994  . ROTATOR CUFF REPAIR Right 2018       No family history on file.  Social History   Tobacco Use  . Smoking status: Former Smoker    Types: Cigarettes  . Smokeless tobacco:  Never Used  Vaping Use  . Vaping Use: Some days  . Devices: Juul  Substance Use Topics  . Alcohol use: Yes    Alcohol/week: 28.0 standard drinks    Types: 14 Glasses of wine, 14 Cans of beer per week  . Drug use: Not Currently    Types: Marijuana    Home Medications Prior to Admission medications   Medication Sig Start Date End Date Taking? Authorizing Provider  albuterol (VENTOLIN HFA) 108 (90 Base) MCG/ACT inhaler Inhale 1 puff into the lungs every 6 (six) hours as needed for wheezing or shortness of breath.    [provider]  amLODipine (NORVASC) 10 MG tablet Take 1 tablet (10 mg total) by mouth daily. 03/12/20   Brunetta Jeans, PA-C  cholecalciferol (VITAMIN D3) 25 MCG (1000 UNIT) tablet Take 1,000 Units by mouth daily.    [provider]  cyanocobalamin 100 MCG tablet Take 100 mcg by mouth daily.    [provider]  HYDROcodone-acetaminophen (NORCO/VICODIN) 5-325 MG tablet Take 1 tablet by mouth every 6 (six) hours as needed. 06/24/20   Dorie Rank, MD  losartan (COZAAR) 100 MG tablet Take 1 tablet (100 mg total) by mouth daily. 03/12/20   Brunetta Jeans, PA-C  Multiple Vitamin (MULTIVITAMIN) capsule Take 1 capsule by mouth daily.    [provider]  Omega-3  Fatty Acids (FISH OIL) 1000 MG CAPS Take by mouth.    [provider]  omeprazole (PRILOSEC) 40 MG capsule Take 1 capsule (40 mg total) by mouth daily. 01/27/20   Brunetta Jeans, PA-C  predniSONE (STERAPRED UNI-PAK 21 TAB) 10 MG (21) TBPK tablet Take 5 tablets (50 mg total) by mouth daily for 2 days, THEN 4 tablets (40 mg total) daily for 2 days, THEN 3 tablets (30 mg total) daily for 2 days, THEN 2 tablets (20 mg total) daily for 2 days, THEN 1 tablet (10 mg total) daily for 2 days. 06/24/20 07/04/20  Dorie Rank, MD  sulindac (CLINORIL) 150 MG tablet Take 1 tablet (150 mg total) by mouth daily. 03/12/20   Brunetta Jeans, PA-C  Zinc 50 MG CAPS Take by mouth.    [provider]    Allergies    Codeine, Sulfa antibiotics, and Sulfur  Review of Systems   Review of Systems  Genitourinary: Negative for difficulty urinating.  Neurological: Negative for weakness.  All other systems reviewed and are negative.   Physical Exam Updated Vital Signs BP (!) 157/97   Pulse 62   Temp 98.9 F (37.2 C) (Oral)   Resp 15   Ht 1.88 m (6\' 2" )   Wt 102.5 kg   SpO2 95%   BMI 29.02 kg/m   Physical Exam Vitals and nursing note reviewed.  Constitutional:      General: He is not in acute distress.    Appearance: He is well-developed.  HENT:     Head: Normocephalic and atraumatic.     Right Ear: External ear normal.     Left Ear: External ear normal.  Eyes:     General: No scleral icterus.       Right eye: No discharge.        Left eye: No discharge.     Conjunctiva/sclera: Conjunctivae normal.  Neck:     Trachea: No tracheal deviation.  Cardiovascular:     Rate and Rhythm: Normal rate and regular rhythm.  Pulmonary:     Effort: Pulmonary effort is normal. No respiratory distress.     Breath sounds: Normal breath sounds. No stridor. No wheezing or rales.  Abdominal:     General: Bowel sounds are normal. There is no distension.     Palpations: Abdomen is soft.     Tenderness: There is no abdominal tenderness. There is no guarding or rebound.  Musculoskeletal:     Cervical back: Neck supple.     Lumbar back: Tenderness present.  Skin:    General: Skin is warm and dry.     Findings: No rash.  Neurological:     Mental Status: He is alert.     Cranial Nerves: No cranial nerve deficit (no facial droop, extraocular movements intact, no slurred speech).     Sensory: No sensory deficit.     Motor: Motor function is intact. No weakness, abnormal muscle tone or seizure activity.     Coordination: Coordination normal.     Comments: Patient is able to lift both legs off the bed, normal sensation throughout     ED Results / Procedures / Treatments    Labs (all labs ordered are listed, but only abnormal results are displayed) Labs Reviewed  I-STAT CHEM 8, ED    EKG None  Radiology CT Renal Stone Study  Result Date: 06/24/2020 CLINICAL DATA:  Right flank and back pain. EXAM: CT ABDOMEN AND PELVIS WITHOUT CONTRAST TECHNIQUE: Multidetector CT  imaging of the abdomen and pelvis was performed following the standard protocol without IV contrast. COMPARISON:  CT pelvis [facility] dated 07/08/2015 FINDINGS: Lower chest: Several small basilar left lower lobe lung nodules with the largest measuring 4 mm (series 7, image 4). No pleural effusion. Normal heart size. Hepatobiliary: Hepatic steatosis. Unremarkable gallbladder. No biliary dilatation. Pancreas: Scattered small calcifications in the pancreatic body and tail. No pancreatic ductal dilatation or peripancreatic inflammation. Spleen: Unremarkable. Adrenals/Urinary Tract: Unremarkable adrenal glands. Mild nonspecific perinephric stranding bilaterally. 1.2 cm hypodensity in the upper pole of the left kidney consistent with a cyst. No renal calculi or hydronephrosis. Unremarkable bladder. Stomach/Bowel: The stomach is unremarkable. There is no evidence of bowel obstruction. There is diverticulosis of the descending and sigmoid colon without evidence of acute diverticulitis. The appendix is unremarkable. Vascular/Lymphatic: Abdominal aortic atherosclerosis without aneurysm. No enlarged lymph nodes. Reproductive: Mildly enlarged prostate. Other: No intraperitoneal free fluid. Musculoskeletal: 1 cm sclerotic focus along the superior aspect of the right acetabulum, possibly a bone island. Moderate levoscoliosis and advanced disc and facet degeneration in the lumbar spine with moderate to severe multilevel neural foraminal stenosis and moderate multilevel spinal stenosis. IMPRESSION: 1. No acute abnormality identified in the abdomen or pelvis. No urolithiasis. 2. Hepatic steatosis. 3. Colonic diverticulosis. 4.  Small basilar lung nodules measuring up to 4 mm. No follow-up needed if patient is low-risk (and has no known or suspected primary neoplasm). Non-contrast chest CT can be considered in 12 months if patient is high-risk. This recommendation follows the consensus statement: Guidelines for Management of Incidental Pulmonary Nodules Detected on CT Images: From the Fleischner Society 2017; Radiology 2017; 284:228-243. 5. Aortic Atherosclerosis (ICD10-I70.0). Electronically Signed   By: Logan Bores M.D.   On: 06/24/2020 15:50   SLEEP STUDY DOCUMENTS  Result Date: 06/24/2020 Ordered by an unspecified provider.   Procedures Procedures (including critical care time)  Medications Ordered in ED Medications  HYDROmorphone (DILAUDID) injection 1 mg (1 mg Intravenous Given 06/24/20 1449)  ondansetron (ZOFRAN) injection 4 mg (4 mg Intravenous Given 06/24/20 1449)    ED Course  I have reviewed the triage vital signs and the nursing notes.  Pertinent labs & imaging results that were available during my care of the patient were reviewed by me and considered in my medical decision making (see chart for details).    MDM Rules/Calculators/A&P                         Suspect this may be a sciatica exacerbation.  However with his complaints of nausea vomiting I will order a CT renal study to make sure there is no other acute pathology, ie ureteral stone.  IV pain medications ordered.  CT scan without acute findings.  Labs unremarkable.  No signs of infection. No acute neuro dysfunction to warrant emergent MRI. Suspect sx related to sciatica.  Treat with pain meds.  Patient has seen [place] [facility] before.  Recommend follow up.  Final Clinical Impression(s) / ED Diagnoses Final diagnoses:  Sciatica, unspecified laterality    Rx / DC Orders ED Discharge Orders         Ordered    HYDROcodone-acetaminophen (NORCO/VICODIN) 5-325 MG tablet  Every 6 hours PRN        06/24/20 1610    predniSONE (STERAPRED  UNI-PAK 21 TAB) 10 MG (21) TBPK tablet        06/24/20 1610           Dorie Rank,  MD 06/24/20 1612

## 2020-06-24 NOTE — ED Notes (Signed)
Patient transported to CT 

## 2020-07-01 ENCOUNTER — Telehealth: Payer: Self-pay | Admitting: Pulmonary Disease

## 2020-07-01 DIAGNOSIS — G4733 Obstructive sleep apnea (adult) (pediatric): Secondary | ICD-10-CM

## 2020-07-01 NOTE — Procedures (Signed)
Patient Name: Scott Montoya, Scott Montoya Date: 06/19/2020 Gender: Male D.O.B: September 04, 1955 Age (years): 89 Referring Provider: Kara Mead MD, ABSM Height (inches): 73 Interpreting Physician: Kara Mead MD, ABSM Weight (lbs): 227 RPSGT: Jorge Ny BMI: 30 MRN: [id number] Neck Size: 17.50 <br> <br> CLINICAL INFORMATION The patient is referred for a CPAP titration to treat sleep apnea. Also h/o sleep walking    Date of HST: 05/2020 mild OSA with AHI 13/ hr  SLEEP STUDY TECHNIQUE As per the AASM Manual for the Scoring of Sleep and Associated Events v2.3 (April 2016) with a hypopnea requiring 4% desaturations.  The channels recorded and monitored were frontal, central and occipital EEG, electrooculogram (EOG), submentalis EMG (chin), nasal and oral airflow, thoracic and abdominal wall motion, anterior tibialis EMG, snore microphone, electrocardiogram, and pulse oximetry. Continuous positive airway pressure (CPAP) was initiated at the beginning of the study and titrated to treat sleep-disordered breathing.  MEDICATIONS Medications self-administered by patient taken the night of the study : N/A  TECHNICIAN COMMENTS Comments added by technician: Drinking beer prior tro starting study Patient had more than two awakenings to use the bathroom. Patient was restless all through the night.  RESPIRATORY PARAMETERS Optimal PAP Pressure (cm): 15 AHI at Optimal Pressure (/hr): 0.9 Overall Minimal O2 (%): 86.00 Supine % at Optimal Pressure (%): 0 Minimal O2 at Optimal Pressure (%): 91.0   SLEEP ARCHITECTURE The study was initiated at 11:00:02 PM and ended at 5:02:52 AM.  Sleep onset time was 6.9 minutes and the sleep efficiency was 65.7%. The total sleep time was 238.4 minutes.  The patient spent 19.08% of the night in stage N1 sleep, 72.74% in stage N2 sleep, 0.00% in stage N3 and 8.2% in REM.Stage REM latency was 123.5 minutes  Wake after sleep onset was 117.5. Alpha intrusion was absent.  Supine sleep was 42.35%.  CARDIAC DATA The 2 lead EKG demonstrated sinus rhythm. The mean heart rate was 60.34 beats per minute. Other EKG findings include: PVCs. LEG MOVEMENT DATA The total Periodic Limb Movements of Sleep (PLMS) were 152. The PLMS index was 38.25. A PLMS index of <15 is considered normal in adults.  IMPRESSIONS - The optimal PAP pressure was 15 cm of water. - Central sleep apnea was not noted during this titration (CAI = 0.0/h). - Moderate oxygen desaturations were observed during this titration (min O2 = 86.00%). - The patient snored with moderate snoring volume during this titration study. - 2-lead EKG demonstrated: PVCs - Moderate periodic limb movements were observed during this study. Arousals associated with PLMs were rare.   DIAGNOSIS - Obstructive Sleep Apnea (G47.33)   RECOMMENDATIONS - Trial of CPAP therapy on 15 cm H2O with a Large size Resmed Full Face Mask AirFit F20 mask and heated humidification. - Avoid alcohol, sedatives and other CNS depressants that may worsen sleep apnea and disrupt normal sleep architecture. - Sleep hygiene should be reviewed to assess factors that may improve sleep quality. - Weight management and regular exercise should be initiated or continued. - Return to [facility] Center for re-evaluation after 4 weeks of therapy  Kara Mead MD Board Certified in [place]

## 2020-07-01 NOTE — Telephone Encounter (Signed)
Pl send Rx for auto CPAP 10-15 cm,  Large size Resmed Full Face Mask AirFit F20 mask  OV with APP in 6-8 wks

## 2020-07-03 NOTE — Telephone Encounter (Signed)
Called patient and went over Dr Bari Mantis recommendation for auto CPAP and mask. Patient agreeable to placing order at this time. Order placed per Dr Elsworth Soho. Nothing further needed at this time.

## 2020-07-15 ENCOUNTER — Encounter (HOSPITAL_BASED_OUTPATIENT_CLINIC_OR_DEPARTMENT_OTHER): Payer: 59 | Admitting: Pulmonary Disease

## 2020-07-16 ENCOUNTER — Telehealth: Payer: Self-pay | Admitting: Pulmonary Disease

## 2020-07-20 NOTE — Telephone Encounter (Signed)
I just tried calling [facility] and their office if closed today. The order was placed on 07/03/2020 and I already know there is a 8/12 weeks backlog for Cpaps. Patient now has [facility] # [id number] for 2022.  I have spoke with the patient and he knows I will be calling [facility] back tomorrow

## 2020-07-21 DIAGNOSIS — M9902 Segmental and somatic dysfunction of thoracic region: Secondary | ICD-10-CM | POA: Diagnosis not present

## 2020-07-21 DIAGNOSIS — M9904 Segmental and somatic dysfunction of sacral region: Secondary | ICD-10-CM | POA: Diagnosis not present

## 2020-07-21 DIAGNOSIS — M5136 Other intervertebral disc degeneration, lumbar region: Secondary | ICD-10-CM | POA: Diagnosis not present

## 2020-07-21 DIAGNOSIS — M5135 Other intervertebral disc degeneration, thoracolumbar region: Secondary | ICD-10-CM | POA: Diagnosis not present

## 2020-07-21 DIAGNOSIS — M9903 Segmental and somatic dysfunction of lumbar region: Secondary | ICD-10-CM | POA: Diagnosis not present

## 2020-07-21 DIAGNOSIS — M5417 Radiculopathy, lumbosacral region: Secondary | ICD-10-CM | POA: Diagnosis not present

## 2020-07-21 NOTE — Telephone Encounter (Signed)
I have spoke with Tiffany with [employer] and gave her Mr. Scott Montoya's new insurance information. I have now spoke with Mr. Scott Montoya and he is still concerned about insurance paying for his sleep study. I told him I would call and see if anything else is needed

## 2020-07-21 NOTE — Telephone Encounter (Signed)
Scott Montoya stated that he had gotten a letter requesting more information from our office to approve payment for the Cpap Titration Study that was done on 06/19/20. I have faxed all the records that we have on the patient to [facility] Fax #'s [phone] and [phone] with  Claim # [id number]. I have spoke with Scott Montoya and he is aware of what I am doing.

## 2020-07-22 ENCOUNTER — Telehealth: Payer: Self-pay | Admitting: Physician Assistant

## 2020-07-22 DIAGNOSIS — I10 Essential (primary) hypertension: Secondary | ICD-10-CM

## 2020-07-22 DIAGNOSIS — K219 Gastro-esophageal reflux disease without esophagitis: Secondary | ICD-10-CM

## 2020-07-22 DIAGNOSIS — M5416 Radiculopathy, lumbar region: Secondary | ICD-10-CM

## 2020-07-22 MED ORDER — OMEPRAZOLE 40 MG PO CPDR: 40.0000 mg | DELAYED_RELEASE_CAPSULE | Freq: Every day | ORAL | 2 refills | Status: DC

## 2020-07-22 MED ORDER — LOSARTAN POTASSIUM 100 MG PO TABS: 100.0000 mg | ORAL_TABLET | Freq: Every day | ORAL | 2 refills | Status: DC

## 2020-07-22 MED ORDER — AMLODIPINE BESYLATE 10 MG PO TABS: 10.0000 mg | ORAL_TABLET | Freq: Every day | ORAL | 2 refills | Status: DC

## 2020-07-22 NOTE — Telephone Encounter (Signed)
I have received confirmation that the fax was received on 07/21/20.

## 2020-07-22 NOTE — Telephone Encounter (Signed)
Pt also needs Sulindac sent to [facility] home Delivery   Please advise

## 2020-07-22 NOTE — Telephone Encounter (Signed)
Rx sent to the preferred patient pharmacy  

## 2020-07-22 NOTE — Telephone Encounter (Signed)
Patient would like his prescriptions sent to [facility] -   He needs refills on all his medications and it has to be 100 day supply - Please advise .  Patient has 9 of omeprozole - but has at least  23 of the others.

## 2020-07-23 DIAGNOSIS — M5135 Other intervertebral disc degeneration, thoracolumbar region: Secondary | ICD-10-CM | POA: Diagnosis not present

## 2020-07-23 DIAGNOSIS — M9904 Segmental and somatic dysfunction of sacral region: Secondary | ICD-10-CM | POA: Diagnosis not present

## 2020-07-23 DIAGNOSIS — M9902 Segmental and somatic dysfunction of thoracic region: Secondary | ICD-10-CM | POA: Diagnosis not present

## 2020-07-23 DIAGNOSIS — M9903 Segmental and somatic dysfunction of lumbar region: Secondary | ICD-10-CM | POA: Diagnosis not present

## 2020-07-23 DIAGNOSIS — M5136 Other intervertebral disc degeneration, lumbar region: Secondary | ICD-10-CM | POA: Diagnosis not present

## 2020-07-23 DIAGNOSIS — M5417 Radiculopathy, lumbosacral region: Secondary | ICD-10-CM | POA: Diagnosis not present

## 2020-07-23 MED ORDER — SULINDAC 150 MG PO TABS: 150.0000 mg | ORAL_TABLET | Freq: Every day | ORAL | 1 refills | Status: DC

## 2020-07-23 NOTE — Telephone Encounter (Signed)
Sulindac also sent to new pharmacy [facility] home delivery

## 2020-07-23 NOTE — Addendum Note (Signed)
Addended by: Con Memos on: 07/23/2020 10:30 AM   Modules accepted: Orders

## 2020-07-27 ENCOUNTER — Ambulatory Visit: Payer: 59 | Admitting: Physician Assistant

## 2020-07-28 DIAGNOSIS — M9903 Segmental and somatic dysfunction of lumbar region: Secondary | ICD-10-CM | POA: Diagnosis not present

## 2020-07-28 DIAGNOSIS — M9904 Segmental and somatic dysfunction of sacral region: Secondary | ICD-10-CM | POA: Diagnosis not present

## 2020-07-28 DIAGNOSIS — M5136 Other intervertebral disc degeneration, lumbar region: Secondary | ICD-10-CM | POA: Diagnosis not present

## 2020-07-28 DIAGNOSIS — M9902 Segmental and somatic dysfunction of thoracic region: Secondary | ICD-10-CM | POA: Diagnosis not present

## 2020-07-28 DIAGNOSIS — M5135 Other intervertebral disc degeneration, thoracolumbar region: Secondary | ICD-10-CM | POA: Diagnosis not present

## 2020-07-28 DIAGNOSIS — M5417 Radiculopathy, lumbosacral region: Secondary | ICD-10-CM | POA: Diagnosis not present

## 2020-07-30 DIAGNOSIS — M9902 Segmental and somatic dysfunction of thoracic region: Secondary | ICD-10-CM | POA: Diagnosis not present

## 2020-07-30 DIAGNOSIS — M5136 Other intervertebral disc degeneration, lumbar region: Secondary | ICD-10-CM | POA: Diagnosis not present

## 2020-07-30 DIAGNOSIS — M9904 Segmental and somatic dysfunction of sacral region: Secondary | ICD-10-CM | POA: Diagnosis not present

## 2020-07-30 DIAGNOSIS — M9903 Segmental and somatic dysfunction of lumbar region: Secondary | ICD-10-CM | POA: Diagnosis not present

## 2020-07-30 DIAGNOSIS — M5417 Radiculopathy, lumbosacral region: Secondary | ICD-10-CM | POA: Diagnosis not present

## 2020-07-30 DIAGNOSIS — M5135 Other intervertebral disc degeneration, thoracolumbar region: Secondary | ICD-10-CM | POA: Diagnosis not present

## 2020-07-31 ENCOUNTER — Telehealth: Payer: Self-pay

## 2020-07-31 NOTE — Telephone Encounter (Signed)
ATC to see if pt has recivied CPAP yet. If pt has not he needs 08/03/20 apt rescheduled for 6 weeks from now. If pt calls back please reschedule apt.

## 2020-08-03 ENCOUNTER — Ambulatory Visit: Payer: 59 | Admitting: Primary Care

## 2020-08-04 DIAGNOSIS — M9903 Segmental and somatic dysfunction of lumbar region: Secondary | ICD-10-CM | POA: Diagnosis not present

## 2020-08-04 DIAGNOSIS — M9904 Segmental and somatic dysfunction of sacral region: Secondary | ICD-10-CM | POA: Diagnosis not present

## 2020-08-04 DIAGNOSIS — M9902 Segmental and somatic dysfunction of thoracic region: Secondary | ICD-10-CM | POA: Diagnosis not present

## 2020-08-04 DIAGNOSIS — M5136 Other intervertebral disc degeneration, lumbar region: Secondary | ICD-10-CM | POA: Diagnosis not present

## 2020-08-04 DIAGNOSIS — M5135 Other intervertebral disc degeneration, thoracolumbar region: Secondary | ICD-10-CM | POA: Diagnosis not present

## 2020-08-04 DIAGNOSIS — M5417 Radiculopathy, lumbosacral region: Secondary | ICD-10-CM | POA: Diagnosis not present

## 2020-08-04 DIAGNOSIS — M48062 Spinal stenosis, lumbar region with neurogenic claudication: Secondary | ICD-10-CM | POA: Diagnosis not present

## 2020-08-06 ENCOUNTER — Telehealth: Payer: Self-pay | Admitting: Physician Assistant

## 2020-08-06 NOTE — Telephone Encounter (Signed)
Pt called in stating that he tested positive for covid, he wanted to know if he could double up on his sulindac if the pain gets to bad. He also wanted to know what he can take OTC to help with his covid symptoms. He starts that he has a sore throat, congestion and a cough.   Please advise   Pt can be reached at the home #

## 2020-08-06 NOTE — Telephone Encounter (Signed)
Any other recommendations other than Tylenol or Ibuprofen for fever and chills, Vitamin D3 1000 units daily, Vitamin C 1000 mg daily, Zinc 50 mg daily, rest and increase hydration

## 2020-08-06 NOTE — Telephone Encounter (Signed)
Agree with recommendations noted. No he should not take any more of his sulindac than directed

## 2020-08-07 NOTE — Telephone Encounter (Signed)
Advised patient of PCP recommendations. Adding Tylenol or Ibuprofen for pain, vitamins, can take Delysm for cough and Mucinex for congestion.

## 2020-08-11 DIAGNOSIS — M9904 Segmental and somatic dysfunction of sacral region: Secondary | ICD-10-CM | POA: Diagnosis not present

## 2020-08-11 DIAGNOSIS — M5135 Other intervertebral disc degeneration, thoracolumbar region: Secondary | ICD-10-CM | POA: Diagnosis not present

## 2020-08-11 DIAGNOSIS — M9902 Segmental and somatic dysfunction of thoracic region: Secondary | ICD-10-CM | POA: Diagnosis not present

## 2020-08-11 DIAGNOSIS — M5417 Radiculopathy, lumbosacral region: Secondary | ICD-10-CM | POA: Diagnosis not present

## 2020-08-11 DIAGNOSIS — M9903 Segmental and somatic dysfunction of lumbar region: Secondary | ICD-10-CM | POA: Diagnosis not present

## 2020-08-11 DIAGNOSIS — M5136 Other intervertebral disc degeneration, lumbar region: Secondary | ICD-10-CM | POA: Diagnosis not present

## 2020-08-13 DIAGNOSIS — M9904 Segmental and somatic dysfunction of sacral region: Secondary | ICD-10-CM | POA: Diagnosis not present

## 2020-08-13 DIAGNOSIS — M5417 Radiculopathy, lumbosacral region: Secondary | ICD-10-CM | POA: Diagnosis not present

## 2020-08-13 DIAGNOSIS — M5136 Other intervertebral disc degeneration, lumbar region: Secondary | ICD-10-CM | POA: Diagnosis not present

## 2020-08-13 DIAGNOSIS — M9902 Segmental and somatic dysfunction of thoracic region: Secondary | ICD-10-CM | POA: Diagnosis not present

## 2020-08-13 DIAGNOSIS — M5135 Other intervertebral disc degeneration, thoracolumbar region: Secondary | ICD-10-CM | POA: Diagnosis not present

## 2020-08-13 DIAGNOSIS — M9903 Segmental and somatic dysfunction of lumbar region: Secondary | ICD-10-CM | POA: Diagnosis not present

## 2020-08-18 DIAGNOSIS — M9902 Segmental and somatic dysfunction of thoracic region: Secondary | ICD-10-CM | POA: Diagnosis not present

## 2020-08-18 DIAGNOSIS — M5417 Radiculopathy, lumbosacral region: Secondary | ICD-10-CM | POA: Diagnosis not present

## 2020-08-18 DIAGNOSIS — M9904 Segmental and somatic dysfunction of sacral region: Secondary | ICD-10-CM | POA: Diagnosis not present

## 2020-08-18 DIAGNOSIS — M9903 Segmental and somatic dysfunction of lumbar region: Secondary | ICD-10-CM | POA: Diagnosis not present

## 2020-08-18 DIAGNOSIS — M5135 Other intervertebral disc degeneration, thoracolumbar region: Secondary | ICD-10-CM | POA: Diagnosis not present

## 2020-08-18 DIAGNOSIS — M5136 Other intervertebral disc degeneration, lumbar region: Secondary | ICD-10-CM | POA: Diagnosis not present

## 2020-08-20 DIAGNOSIS — M5417 Radiculopathy, lumbosacral region: Secondary | ICD-10-CM | POA: Diagnosis not present

## 2020-08-20 DIAGNOSIS — M5135 Other intervertebral disc degeneration, thoracolumbar region: Secondary | ICD-10-CM | POA: Diagnosis not present

## 2020-08-20 DIAGNOSIS — M9902 Segmental and somatic dysfunction of thoracic region: Secondary | ICD-10-CM | POA: Diagnosis not present

## 2020-08-20 DIAGNOSIS — M5136 Other intervertebral disc degeneration, lumbar region: Secondary | ICD-10-CM | POA: Diagnosis not present

## 2020-08-20 DIAGNOSIS — M9903 Segmental and somatic dysfunction of lumbar region: Secondary | ICD-10-CM | POA: Diagnosis not present

## 2020-08-20 DIAGNOSIS — M9904 Segmental and somatic dysfunction of sacral region: Secondary | ICD-10-CM | POA: Diagnosis not present

## 2020-08-27 DIAGNOSIS — M9904 Segmental and somatic dysfunction of sacral region: Secondary | ICD-10-CM | POA: Diagnosis not present

## 2020-08-27 DIAGNOSIS — M9902 Segmental and somatic dysfunction of thoracic region: Secondary | ICD-10-CM | POA: Diagnosis not present

## 2020-08-27 DIAGNOSIS — M5135 Other intervertebral disc degeneration, thoracolumbar region: Secondary | ICD-10-CM | POA: Diagnosis not present

## 2020-08-27 DIAGNOSIS — M5136 Other intervertebral disc degeneration, lumbar region: Secondary | ICD-10-CM | POA: Diagnosis not present

## 2020-08-27 DIAGNOSIS — M9903 Segmental and somatic dysfunction of lumbar region: Secondary | ICD-10-CM | POA: Diagnosis not present

## 2020-08-27 DIAGNOSIS — M5417 Radiculopathy, lumbosacral region: Secondary | ICD-10-CM | POA: Diagnosis not present

## 2020-09-02 ENCOUNTER — Other Ambulatory Visit: Payer: Self-pay

## 2020-09-02 ENCOUNTER — Ambulatory Visit (INDEPENDENT_AMBULATORY_CARE_PROVIDER_SITE_OTHER): Payer: Medicare HMO | Admitting: Physician Assistant

## 2020-09-02 ENCOUNTER — Encounter: Payer: Self-pay | Admitting: Physician Assistant

## 2020-09-02 VITALS — BP 127/83 | HR 80 | Temp 98.0°F | Ht 74.0 in | Wt 218.4 lb

## 2020-09-02 DIAGNOSIS — I10 Essential (primary) hypertension: Secondary | ICD-10-CM

## 2020-09-02 DIAGNOSIS — G4733 Obstructive sleep apnea (adult) (pediatric): Secondary | ICD-10-CM

## 2020-09-02 DIAGNOSIS — M48061 Spinal stenosis, lumbar region without neurogenic claudication: Secondary | ICD-10-CM

## 2020-09-02 DIAGNOSIS — L989 Disorder of the skin and subcutaneous tissue, unspecified: Secondary | ICD-10-CM

## 2020-09-02 NOTE — Patient Instructions (Addendum)
Please call your insurance and call back if you would like to proceed with single-lesion punch biopsy of the area on your back. We can schedule this at your convenience.  Plan on AWV in 6 months    Lumbosacral Radiculopathy Lumbosacral radiculopathy is a condition that involves the spinal nerves and nerve roots in the low back and bottom of the spine. The condition develops when these nerves and nerve roots move out of place or become inflamed and cause symptoms. What are the causes? This condition may be caused by:  Pressure from a disk that bulges out of place (herniated disk). A disk is a plate of soft cartilage that separates bones in the spine.  Disk changes that occur with age (disk degeneration).  A narrowing of the bones of the lower back (spinal stenosis).  A tumor.  An infection.  An injury that places sudden pressure on the disks that cushion the bones of your lower spine. What increases the risk? You are more likely to develop this condition if:  You are a male who is 16-26 years old.  You are a male who is 78-44 years old.  You use improper technique when lifting things.  You are overweight or live a sedentary lifestyle.  You smoke.  Your work requires frequent lifting.  You do repetitive activities that strain the spine. What are the signs or symptoms? Symptoms of this condition include:  Pain that goes down from your back into your legs (sciatica), usually on one side of the body. This is the most common symptom. The pain may be worse with sitting, coughing, or sneezing.  Pain and numbness in your legs.  Muscle weakness.  Tingling.  Loss of bladder control or bowel control.   How is this diagnosed? This condition may be diagnosed based on:  Your symptoms and medical history.  A physical exam. If the pain is lasting, you may have tests, such as:  MRI scan.  X-ray.  CT scan.  A type of X-ray used to examine the spinal canal after  injecting a dye into your spine (myelogram).  A test to measure how electrical impulses move through a nerve (nerve conduction study). How is this treated? Treatment may depend on the cause of the condition and may include:  Working with a physical therapist.  Taking pain medicine.  Applying heat and ice to affected areas.  Doing stretches to improve flexibility.  Doing exercises to strengthen back muscles.  Having chiropractic spinal manipulation.  Using transcutaneous electrical nerve stimulation (TENS) therapy.  Getting a steroid injection in the spine. In some cases, no treatment is needed. If the condition is long-lasting (chronic), or if symptoms are severe, treatment may involve surgery or lifestyle changes, such as following a weight-loss plan. Follow these instructions at home: Activity  Avoid bending and other activities that make the problem worse.  Maintain a proper position when standing or sitting: ? When standing, keep your upper back and neck straight, with your shoulders pulled back. Avoid slouching. ? When sitting, keep your back straight and relax your shoulders. Do not round your shoulders or pull them backward.  Do not sit or stand in one place for long periods of time.  Take brief periods of rest throughout the day. This will reduce your pain. It is usually better to rest by lying down or standing, not sitting.  When you are resting for longer periods, mix in some mild activity or stretching between periods of rest. This will help  to prevent stiffness and pain.  Get regular exercise. Ask your health care provider what activities are safe for you. If you were shown how to do any exercises or stretches, do them as directed by your health care provider.  Do not lift anything that is heavier than 10 lb (4.5 kg) or the limit that you are told by your health care provider. Always use proper lifting technique, which includes: ? Bending your knees. ? Keeping the  load close to your body. ? Avoiding twisting. Managing pain  If directed, put ice on the affected area: ? Put ice in a plastic bag. ? Place a towel between your skin and the bag. ? Leave the ice on for 20 minutes, 2-3 times a day.  If directed, apply heat to the affected area as often as told by your health care provider. Use the heat source that your health care provider recommends, such as a moist heat pack or a heating pad. ? Place a towel between your skin and the heat source. ? Leave the heat on for 20-30 minutes. ? Remove the heat if your skin turns bright red. This is especially important if you are unable to feel pain, heat, or cold. You may have a greater risk of getting burned.  Take over-the-counter and prescription medicines only as told by your health care provider. General instructions  Sleep on a firm mattress in a comfortable position. Try lying on your side with your knees slightly bent. If you lie on your back, put a pillow under your knees.  Do not drive or use heavy machinery while taking prescription pain medicine.  If your health care provider prescribed a diet or exercise program, follow it as directed.  Keep all follow-up visits as told by your health care provider. This is important. Contact a health care provider if:  Your pain does not improve over time, even when taking pain medicines. Get help right away if:  You develop severe pain.  Your pain suddenly gets worse.  You develop increasing weakness in your legs.  You lose the ability to control your bladder or bowel.  You have difficulty walking or balancing.  You have a fever. Summary  Lumbosacral radiculopathy is a condition that occurs when the spinal nerves and nerve roots in the lower part of the spine move out of place or become inflamed and cause symptoms.  Symptoms include pain, numbness, and tingling that go down from your back into your legs (sciatica), muscle weakness, and loss of  bladder control or bowel control.  If directed, apply ice or heat to the affected area as told by your health care provider.  Follow instructions about activity, rest, and proper lifting technique. This information is not intended to replace advice given to you by your health care provider. Make sure you discuss any questions you have with your health care provider. Document Revised: 05/14/2020 Document Reviewed: 05/14/2020 Elsevier Patient Education  2021 [employer].

## 2020-09-02 NOTE — Progress Notes (Signed)
New Patient Office Visit  Subjective:  Patient ID: Scott Montoya, male    DOB: 05/23/1956  Age: 65 y.o. MRN: [id number]  CC:  Chief Complaint  Patient presents with  . Transitions Of Care    HPI Scott Montoya presents for [facility] visit from [facility], [place]. 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  Hx of low back pain. Dx of foraminal stenosis of lumbar region and osteoarthritis, intermittent R leg radiculopathy. States he has to sleep in a reclining chair otherwise he will be in pain. Chiropractic and water aerobics several times per week. States he has been intentionally losing weight and notices that his HR has improved as well. Down 10 lbs from last year.   OSA diagnosed in winter 2021. Still has yet to receive a CPAP due to a backlog.   Lesion on back he would like for me to examine today. Does not bother him, but says he cannot look at it well.    Past Medical History:  Diagnosis Date  . Cataract   . COPD (chronic obstructive pulmonary disease) ([place])     Past Surgical History:  Procedure Laterality Date  . EYE SURGERY    . foot heel repair  2000  . IR FL GUIDED LOC OF NEEDLE/CATH TIP FOR SPINAL INJECTION LT    . ORBITAL RECONSTRUCTION  1994  . ROTATOR CUFF REPAIR Right 2018    No family history on file.  Social History   Socioeconomic History  . Marital status: Divorced    Spouse name: Not on file  . Number of children: Not on file  . Years of education: Not on file  . Highest education level: Not on file  Occupational History  . Not on file  Tobacco Use  . Smoking status: Former Smoker    Types: Cigarettes  . Smokeless tobacco: Never Used  Vaping Use  . Vaping Use: Some days  . Devices: Juul  Substance and Sexual Activity  . Alcohol use: Yes    Alcohol/week: 28.0 standard drinks    Types: 14 Glasses of wine, 14 Cans of beer per week  . Drug use: Not Currently    Types: Marijuana  . Sexual activity: Yes     Birth control/protection: None  Other Topics Concern  . Not on file  Social History Narrative  . Not on file   Social Determinants of Health   Financial Resource Strain: Not on file  Food Insecurity: Not on file  Transportation Needs: Not on file  Physical Activity: Not on file  Stress: Not on file  Social Connections: Not on file  Intimate Partner Violence: Not on file    ROS Review of Systems  Constitutional: Negative for activity change, appetite change and unexpected weight change.  HENT: Negative for congestion.   Eyes: Negative for visual disturbance.  Respiratory: Negative for apnea and shortness of breath.   Cardiovascular: Negative for chest pain.  Gastrointestinal: Negative for abdominal pain and blood in stool.  Endocrine: Negative for polydipsia, polyphagia and polyuria.  Genitourinary: Negative for difficulty urinating.  Musculoskeletal: Positive for back pain (chronic). Negative for arthralgias.  Skin: Negative for rash.       Lesion on back  Neurological: Negative for seizures, weakness and headaches.  Psychiatric/Behavioral: Negative for sleep disturbance and suicidal ideas.    Objective:   Today's Vitals: BP 127/83   Pulse 80   Temp 98 F (36.7 C)   Ht 6\' 2"  (1.88  m)   Wt 218 lb 6.1 oz (99.1 kg)   SpO2 97%   BMI 28.04 kg/m   Physical Exam Vitals and nursing note reviewed.  Constitutional:      General: He is not in acute distress.    Appearance: Normal appearance. He is not toxic-appearing.  HENT:     Head: Normocephalic and atraumatic.     Right Ear: Tympanic membrane, ear canal and external ear normal.     Left Ear: Tympanic membrane, ear canal and external ear normal.     Nose: Nose normal.     Mouth/Throat:     Mouth: Mucous membranes are moist.     Pharynx: Oropharynx is clear.  Eyes:     Extraocular Movements: Extraocular movements intact.     Conjunctiva/sclera: Conjunctivae normal.     Pupils: Pupils are equal, round, and reactive  to light.  Cardiovascular:     Rate and Rhythm: Normal rate and regular rhythm.     Pulses: Normal pulses.     Heart sounds: Normal heart sounds.  Pulmonary:     Effort: Pulmonary effort is normal.     Breath sounds: Normal breath sounds.  Abdominal:     General: Abdomen is flat. Bowel sounds are normal.     Palpations: Abdomen is soft.     Tenderness: There is no abdominal tenderness.  Musculoskeletal:        General: Normal range of motion.     Cervical back: Normal range of motion and neck supple.  Skin:    General: Skin is warm and dry.     Comments: Left upper back there is an approx 0.5 cm oval nodular flesh-pink colored lesion  Neurological:     General: No focal deficit present.     Mental Status: He is alert and oriented to person, place, and time.  Psychiatric:        Mood and Affect: Mood normal.        Behavior: Behavior normal.     Assessment & Plan:   Problem List Items Addressed This Visit      Cardiovascular and Mediastinum   Benign essential hypertension - Primary     Respiratory   OSA (obstructive sleep apnea)     Musculoskeletal and Integument   Foraminal stenosis of lumbar region    Other Visit Diagnoses    Skin lesion of back          Outpatient Encounter Medications as of 09/02/2020  Medication Sig  . albuterol (VENTOLIN HFA) 108 (90 Base) MCG/ACT inhaler Inhale 1 puff into the lungs every 6 (six) hours as needed for wheezing or shortness of breath.  Marland Kitchen amLODipine (NORVASC) 10 MG tablet Take 1 tablet (10 mg total) by mouth daily.  . [place]-Glucosamine-Vit D (OSTEO BI-FLEX ONE PER DAY PO) Take by mouth.  . cholecalciferol (VITAMIN D3) 25 MCG (1000 UNIT) tablet Take 1,000 Units by mouth daily.  . cyanocobalamin 100 MCG tablet Take 100 mcg by mouth daily.  Marland Kitchen losartan (COZAAR) 100 MG tablet Take 1 tablet (100 mg total) by mouth daily.  . Multiple Vitamin (MULTIVITAMIN) capsule Take 1 capsule by mouth daily.  . Omega-3 Fatty Acids (FISH OIL)  1000 MG CAPS Take by mouth.  Marland Kitchen omeprazole (PRILOSEC) 40 MG capsule Take 1 capsule (40 mg total) by mouth daily.  . Turmeric (QC TUMERIC COMPLEX PO) Take by mouth.  . Zinc 50 MG CAPS Take by mouth.  . sulindac (CLINORIL) 150 MG tablet Take 1 tablet (150  mg total) by mouth daily. (Patient not taking: Reported on 09/02/2020)  . [DISCONTINUED] HYDROcodone-acetaminophen (NORCO/VICODIN) 5-325 MG tablet Take 1 tablet by mouth every 6 (six) hours as needed.   No facility-administered encounter medications on file as of 09/02/2020.    Follow-up: Return in about 6 months (around 03/02/2021) for AWV with labs same day.   1. Benign essential hypertension Stable on Amlodipine 10 mg daily and Cozaar 100 mg daily. He will continue water aerobics and low salt diet.   2. Foraminal stenosis of lumbar region Chronic, but feels like symptoms are improving with chiropractics and water aerobics. Taking Sulindac daily but feels like he does not need it right now. He will d/c at this time and call if any problems.   3. OSA (obstructive sleep apnea) Still waiting on CPAP to come in.  4. Skin lesion of back ?Small BCC - recommend punch biopsy for further evaluation, which I would be happy to do in office for him.  Wilfred Siverson M Steward Sames, PA-C

## 2020-09-09 ENCOUNTER — Other Ambulatory Visit: Payer: Self-pay

## 2020-09-09 ENCOUNTER — Ambulatory Visit (INDEPENDENT_AMBULATORY_CARE_PROVIDER_SITE_OTHER): Payer: Medicare HMO | Admitting: Physician Assistant

## 2020-09-09 ENCOUNTER — Encounter: Payer: Self-pay | Admitting: Physician Assistant

## 2020-09-09 VITALS — BP 143/90 | HR 75 | Temp 98.0°F | Ht 74.0 in | Wt 223.0 lb

## 2020-09-09 DIAGNOSIS — L989 Disorder of the skin and subcutaneous tissue, unspecified: Secondary | ICD-10-CM

## 2020-09-09 DIAGNOSIS — C44519 Basal cell carcinoma of skin of other part of trunk: Secondary | ICD-10-CM | POA: Diagnosis not present

## 2020-09-09 DIAGNOSIS — C4491 Basal cell carcinoma of skin, unspecified: Secondary | ICD-10-CM

## 2020-09-09 HISTORY — DX: Basal cell carcinoma of skin, unspecified: C44.91

## 2020-09-09 NOTE — Progress Notes (Signed)
Acute Office Visit  Subjective:    Patient ID: Scott Montoya, male    DOB: [birth year], 65 y.o.   MRN: [id number]  No chief complaint on file.   HPI Patient is in today for punch biopsy of skin lesion on back.  Past Medical History:  Diagnosis Date  . Cataract   . COPD (chronic obstructive pulmonary disease) ([place])     Past Surgical History:  Procedure Laterality Date  . EYE SURGERY    . foot heel repair  2000  . IR FL GUIDED LOC OF NEEDLE/CATH TIP FOR SPINAL INJECTION LT    . ORBITAL RECONSTRUCTION  1994  . ROTATOR CUFF REPAIR Right 2018    History reviewed. No pertinent family history.  Social History   Socioeconomic History  . Marital status: Divorced    Spouse name: Not on file  . Number of children: Not on file  . Years of education: Not on file  . Highest education level: Not on file  Occupational History  . Not on file  Tobacco Use  . Smoking status: Former Smoker    Types: Cigarettes  . Smokeless tobacco: Never Used  Vaping Use  . Vaping Use: Some days  . Devices: Juul  Substance and Sexual Activity  . Alcohol use: Yes    Alcohol/week: 28.0 standard drinks    Types: 14 Glasses of wine, 14 Cans of beer per week  . Drug use: Not Currently    Types: Marijuana  . Sexual activity: Yes    Birth control/protection: None  Other Topics Concern  . Not on file  Social History Narrative  . Not on file   Social Determinants of Health   Financial Resource Strain: Not on file  Food Insecurity: Not on file  Transportation Needs: Not on file  Physical Activity: Not on file  Stress: Not on file  Social Connections: Not on file  Intimate Partner Violence: Not on file    Outpatient Medications Prior to Visit  Medication Sig Dispense Refill  . albuterol (VENTOLIN HFA) 108 (90 Base) MCG/ACT inhaler Inhale 1 puff into the lungs every 6 (six) hours as needed for wheezing or shortness of breath.    Marland Kitchen amLODipine (NORVASC) 10 MG tablet Take 1 tablet (10 mg total)  by mouth daily. 90 tablet 2  . [place]-Glucosamine-Vit D (OSTEO BI-FLEX ONE PER DAY PO) Take by mouth.    . cholecalciferol (VITAMIN D3) 25 MCG (1000 UNIT) tablet Take 1,000 Units by mouth daily.    . cyanocobalamin 100 MCG tablet Take 100 mcg by mouth daily.    Marland Kitchen losartan (COZAAR) 100 MG tablet Take 1 tablet (100 mg total) by mouth daily. 90 tablet 2  . Multiple Vitamin (MULTIVITAMIN) capsule Take 1 capsule by mouth daily.    . Omega-3 Fatty Acids (FISH OIL) 1000 MG CAPS Take by mouth.    Marland Kitchen omeprazole (PRILOSEC) 40 MG capsule Take 1 capsule (40 mg total) by mouth daily. 90 capsule 2  . sulindac (CLINORIL) 150 MG tablet Take 1 tablet (150 mg total) by mouth daily. 90 tablet 1  . Turmeric (QC TUMERIC COMPLEX PO) Take by mouth.    . Zinc 50 MG CAPS Take by mouth.     No facility-administered medications prior to visit.    Allergies  Allergen Reactions  . Codeine     Other reaction(s): dry mouth  . Elemental Sulfur     Codine   . Sulfa Antibiotics Nausea And Vomiting    Other reaction(s): Unknown  Reaction Other reaction(s): Unknown Reaction     Review of Systems  Skin:       Skin lesion  Neurological: Negative for numbness.       Objective:    Physical Exam Vitals and nursing note reviewed.  Constitutional:      General: He is not in acute distress.    Appearance: Normal appearance. He is not toxic-appearing.  HENT:     Head: Normocephalic and atraumatic.     Right Ear: Tympanic membrane, ear canal and external ear normal.     Left Ear: Tympanic membrane, ear canal and external ear normal.     Nose: Nose normal.     Mouth/Throat:     Mouth: Mucous membranes are moist.     Pharynx: Oropharynx is clear.  Eyes:     Extraocular Movements: Extraocular movements intact.     Conjunctiva/sclera: Conjunctivae normal.     Pupils: Pupils are equal, round, and reactive to light.  Cardiovascular:     Rate and Rhythm: Normal rate and regular rhythm.     Pulses: Normal pulses.      Heart sounds: Normal heart sounds.  Pulmonary:     Effort: Pulmonary effort is normal.     Breath sounds: Normal breath sounds.  Abdominal:     General: Abdomen is flat. Bowel sounds are normal.     Palpations: Abdomen is soft.     Tenderness: There is no abdominal tenderness.  Musculoskeletal:        General: Normal range of motion.     Cervical back: Normal range of motion and neck supple.  Skin:    General: Skin is warm and dry.     Comments: Left upper back there is an approx 0.5 cm oval nodular flesh-pink colored lesion with some macular flesh-pink scaly irregular shape developing around the 5 o'clock position.  Neurological:     General: No focal deficit present.     Mental Status: He is alert and oriented to person, place, and time.  Psychiatric:        Mood and Affect: Mood normal.        Behavior: Behavior normal.     BP (!) 143/90   Pulse 75   Temp 98 F (36.7 C)   Ht 6\' 2"  (1.88 m)   Wt 223 lb (101.2 kg)   SpO2 97%   BMI 28.63 kg/m  Wt Readings from Last 3 Encounters:  09/09/20 223 lb (101.2 kg)  09/02/20 218 lb 6.1 oz (99.1 kg)  06/24/20 226 lb (102.5 kg)       Assessment & Plan:   Problem List Items Addressed This Visit   None   Visit Diagnoses    Skin lesion of back    -  Primary     1. Skin lesion of back Procedure explained and consent obtained from the patient today.  Verified that he had no allergies that would be involved in the procedure today.  The area was prepped in the usual manner with iodine cleansing solution.  Local anesthesia achieved with 2 cc of lidocaine with epinephrine using a 30-gauge needle.  A 4 mm punch biopsy was performed on the border of the raised lesion and the area of new appearing change.  The sample was sent to pathology.  Bleeding controlled with Drysol.  Healing will take place by secondary intention and this was explained to the patient.  He tolerated the procedure very well.  Band-Aid with Vaseline was placed.  Aftercare instructions provided to the patient.   Alyssa M Allwardt, PA-C

## 2020-09-09 NOTE — Patient Instructions (Signed)
I will call with pathology report. Continue to replace Bandage daily and apply Vaseline to area. Gently wash with soap and water. Call if any signs of infection including redness, discharge, pain, or fever.

## 2020-09-15 ENCOUNTER — Telehealth: Payer: Self-pay | Admitting: Pulmonary Disease

## 2020-09-15 NOTE — Telephone Encounter (Signed)
Called and spoke with [name].  [name] stated cpap's being called and shipped are in December 2021 at this time. [name] stated Scott Montoya should be contacted in the next few weeks. Called and spoke with Scott Montoya.  Made Scott Montoya aware of delay and he should receive a call in the next few weeks from [place].

## 2020-09-16 ENCOUNTER — Other Ambulatory Visit: Payer: Self-pay

## 2020-09-16 ENCOUNTER — Ambulatory Visit: Payer: Medicare HMO

## 2020-09-17 DIAGNOSIS — M5136 Other intervertebral disc degeneration, lumbar region: Secondary | ICD-10-CM | POA: Diagnosis not present

## 2020-09-17 DIAGNOSIS — M9902 Segmental and somatic dysfunction of thoracic region: Secondary | ICD-10-CM | POA: Diagnosis not present

## 2020-09-17 DIAGNOSIS — M5417 Radiculopathy, lumbosacral region: Secondary | ICD-10-CM | POA: Diagnosis not present

## 2020-09-17 DIAGNOSIS — M9903 Segmental and somatic dysfunction of lumbar region: Secondary | ICD-10-CM | POA: Diagnosis not present

## 2020-09-17 DIAGNOSIS — M9904 Segmental and somatic dysfunction of sacral region: Secondary | ICD-10-CM | POA: Diagnosis not present

## 2020-09-17 DIAGNOSIS — M5135 Other intervertebral disc degeneration, thoracolumbar region: Secondary | ICD-10-CM | POA: Diagnosis not present

## 2020-09-18 ENCOUNTER — Ambulatory Visit: Payer: 59 | Admitting: Adult Health

## 2020-09-18 ENCOUNTER — Ambulatory Visit: Payer: 59 | Admitting: Primary Care

## 2020-09-22 ENCOUNTER — Telehealth: Payer: Self-pay

## 2020-09-22 NOTE — Telephone Encounter (Signed)
Pt called asking about the results from his biopsy that was done on 09/09/2020. Please advise.

## 2020-09-23 NOTE — Telephone Encounter (Signed)
Patient is calling in wondering why the biopsy is not back yet, please advise. Patient would like a phone call in return.

## 2020-09-23 NOTE — Telephone Encounter (Signed)
Please apologize to patient again.  He is not the only one who has not had his results back in a timely manner.  It looks like we have discovered the issue and will get IT involved to fix it on our end.  His results are found and the verdict is that he has a superficial basal cell carcinoma, as I suspected this might have been the case.  He will need to see dermatology for complete removal and if you can please send a referral for him.  Thank you.

## 2020-09-23 NOTE — Telephone Encounter (Signed)
The order is there but I still do not see any results yet.

## 2020-09-23 NOTE — Telephone Encounter (Signed)
Patient would like an update on his results even if it to tell him its going to be another week he feels as if "two weeks is a long time for this", please advise patient of anything he would just like a call back with an update.

## 2020-09-24 ENCOUNTER — Encounter: Payer: Self-pay | Admitting: Physician Assistant

## 2020-09-24 ENCOUNTER — Other Ambulatory Visit: Payer: Self-pay | Admitting: Physician Assistant

## 2020-09-24 DIAGNOSIS — C44519 Basal cell carcinoma of skin of other part of trunk: Secondary | ICD-10-CM

## 2020-09-24 NOTE — Telephone Encounter (Signed)
Patient notified he voices understanding ° °

## 2020-10-01 ENCOUNTER — Encounter: Payer: Self-pay | Admitting: Physician Assistant

## 2020-10-01 ENCOUNTER — Ambulatory Visit (INDEPENDENT_AMBULATORY_CARE_PROVIDER_SITE_OTHER): Payer: Medicare HMO | Admitting: Physician Assistant

## 2020-10-01 ENCOUNTER — Other Ambulatory Visit: Payer: Self-pay

## 2020-10-01 VITALS — BP 130/85 | HR 84 | Temp 98.0°F | Ht 74.0 in | Wt 216.0 lb

## 2020-10-01 DIAGNOSIS — Z1211 Encounter for screening for malignant neoplasm of colon: Secondary | ICD-10-CM | POA: Diagnosis not present

## 2020-10-01 DIAGNOSIS — Z23 Encounter for immunization: Secondary | ICD-10-CM | POA: Diagnosis not present

## 2020-10-01 DIAGNOSIS — M720 Palmar fascial fibromatosis [Dupuytren]: Secondary | ICD-10-CM | POA: Diagnosis not present

## 2020-10-01 NOTE — Patient Instructions (Addendum)
Someone will call to schedule about your hand contracture. You may try gentle massage and ice to the area, as well as Voltaren gel four times daily to this area.  We have faxed your Cologuard order.  You had your flu vaccine in October 2021.   Your Prevnar 13 vaccine was updated today.    Dupuytren's Contracture Dupuytren's contracture is a condition in which tissue under the skin of the palm becomes thick. This causes one or more of the fingers to curl inward (contract) toward the palm. After a while, the fingers may not be able to straighten out. This condition affects some or all of the fingers and the palm of the hand. This condition may affect one or both hands. Dupuytren's contracture is a long-term (chronic) condition that develops (progresses) slowly over time. There is no cure, but symptoms can be managed and progression can be slowed with treatment. This condition is usually not dangerous or painful, but it can interfere with everyday tasks. What are the causes? This condition is caused by tissue (fascia) in the palm that gets thicker and tighter. When the fascia thickens, it pulls on the cords of tissue (tendons) that control finger movement. This causes the fingers to contract. The cause of fascia thickening is not known. However, the condition is often passed along from parent to child (inherited).   What increases the risk? The following factors may make you more likely to develop this condition:  Being 83 years of age or older.  Being male.  Having a family history of this condition.  Using tobacco products, including cigarettes, chewing tobacco, and e-cigarettes.  Drinking alcohol excessively.  Having diabetes.  Having a seizure disorder. What are the signs or symptoms? Early symptoms of this condition may include:  Thick, puckered skin on the hand.  One or more lumps (nodules) on the palm. Nodules may be tender when they first appear, but they are generally  painless. Later symptoms of this condition may include:  Thick cords of tissue in the palm.  Fingers curled up toward the palm.  Inability to straighten the fingers into their normal position. Though this condition is usually painless, you may have discomfort when holding or grabbing objects.   How is this diagnosed? This condition is diagnosed with a physical exam, which may include:  Looking at your hands and feeling your palms. This is to check for thickened fascia and nodules.  Measuring finger motion.  Doing the [place] tabletop test. You may be asked to try to put your hand on a surface, with your palm down and your fingers straight out. How is this treated? There is no cure for this condition, but treatment can relieve discomfort and make symptoms more manageable. Treatment options may include:  Physical therapy. This can strengthen your hand and increase flexibility.  Occupational therapy. This can help you with everyday tasks that may be more difficult because of your condition.  Shots (injections). Substances may be injected into your hand, such as: ? Medicines that help to decrease swelling (corticosteroids). ? Proteins (collagenase) to weaken thick tissue. After a collagenase injection, your health care provider may stretch your fingers.  Needle aponeurotomy. A needle is pushed through the skin and into the fascia. Moving the needle against the fascia can weaken or break up the thick tissue.  Surgery. This may be needed if your condition causes discomfort or interferes with everyday activities. Physical therapy is usually needed after surgery. No treatment is guaranteed to cure this condition.  Recurrence of symptoms is common. Follow these instructions at home: Hand care  Take these actions to help protect your hand from possible injury: ? Use tools that have padded grips. ? Wear protective gloves while you work with your hands. ? Avoid repetitive hand  movements. General instructions  Take over-the-counter and prescription medicines only as told by your health care provider.  Manage any other conditions that you have, such as diabetes.  If physical therapy was prescribed, do exercises as told by your health care provider.  Do not use any products that contain nicotine or tobacco, such as cigarettes, e-cigarettes, and chewing tobacco. If you need help quitting, ask your health care provider.  If you drink alcohol: ? Limit how much you use to:  0-1 drink a day for women.  0-2 drinks a day for men. ? Be aware of how much alcohol is in your drink. In the [place] one drink equals one 12 oz bottle of beer (355 mL), one 5 oz glass of wine (148 mL), or one 1 oz glass of hard liquor (44 mL).  Keep all follow-up visits as told by your health care provider. This is important. Contact a health care provider if:  You develop new symptoms, or your symptoms get worse.  You have pain that gets worse or does not get better with medicine.  You have difficulty or discomfort with everyday tasks.  You develop numbness or tingling. Get help right away if:  You have severe pain.  Your fingers change color or become unusually cold. Summary  Dupuytren's contracture is a condition in which tissue under the skin of the palm becomes thick.  This condition is caused by tissue (fascia) that thickens. When it thickens, it pulls on the cords of tissue (tendons) that control finger movement and makes the fingers to contract.  You are more likely to develop this condition if you are a man, are over 22 years of age, have a family history of the condition, and drink a lot of alcohol.  This condition can be treated with physical and occupational therapy, injections, and surgery.  Follow instructions about how to care for your hand. Get help right away if you have severe pain or your fingers change color or become cold. This information is not intended to  replace advice given to you by your health care provider. Make sure you discuss any questions you have with your health care provider. Document Revised: 01/23/2018 Document Reviewed: 01/23/2018 Elsevier Patient Education  [place].

## 2020-10-01 NOTE — Progress Notes (Signed)
Acute Office Visit  Subjective:    Patient ID: Scott Montoya, male    DOB: 12-17-1955, 65 y.o.   MRN: [id number]  No chief complaint on file.   HPI Patient is in today for left ring finger pain for the last few weeks.  He states that this finger is stiff and slightly bent and will not move any further.  He states his dad had several contractures in his hands near the end of his life and he does not want to end up that way; he would rather take care of the sooner.  He has not tried any medications or any treatments for this finger yet.  He is right-hand dominant.  He denies any numbness or tingling, coldness to the extremity, change in color, or severe swelling.  He did not injure this finger.  He would also like to ask about Cologuard screening.  As well as flu and pneumonia vaccines today.  Past Medical History:  Diagnosis Date  . Cataract   . COPD (chronic obstructive pulmonary disease) ([place])     Past Surgical History:  Procedure Laterality Date  . EYE SURGERY    . foot heel repair  2000  . IR FL GUIDED LOC OF NEEDLE/CATH TIP FOR SPINAL INJECTION LT    . ORBITAL RECONSTRUCTION  1994  . ROTATOR CUFF REPAIR Right 2018    History reviewed. No pertinent family history.  Social History   Socioeconomic History  . Marital status: Divorced    Spouse name: Not on file  . Number of children: Not on file  . Years of education: Not on file  . Highest education level: Not on file  Occupational History  . Not on file  Tobacco Use  . Smoking status: Former Smoker    Types: Cigarettes  . Smokeless tobacco: Never Used  Vaping Use  . Vaping Use: Some days  . Devices: Juul  Substance and Sexual Activity  . Alcohol use: Yes    Alcohol/week: 28.0 standard drinks    Types: 14 Glasses of wine, 14 Cans of beer per week  . Drug use: Not Currently    Types: Marijuana  . Sexual activity: Yes    Birth control/protection: None  Other Topics Concern  . Not on file  Social History  Narrative  . Not on file   Social Determinants of Health   Financial Resource Strain: Not on file  Food Insecurity: Not on file  Transportation Needs: Not on file  Physical Activity: Not on file  Stress: Not on file  Social Connections: Not on file  Intimate Partner Violence: Not on file    Outpatient Medications Prior to Visit  Medication Sig Dispense Refill  . albuterol (VENTOLIN HFA) 108 (90 Base) MCG/ACT inhaler Inhale 1 puff into the lungs every 6 (six) hours as needed for wheezing or shortness of breath.    Marland Kitchen amLODipine (NORVASC) 10 MG tablet Take 1 tablet (10 mg total) by mouth daily. 90 tablet 2  . aspirin EC 81 MG tablet Take 81 mg by mouth daily. Swallow whole.    . [place]-Glucosamine-Vit D (OSTEO BI-FLEX ONE PER DAY PO) Take by mouth.    . cholecalciferol (VITAMIN D3) 25 MCG (1000 UNIT) tablet Take 1,000 Units by mouth daily.    . cyanocobalamin 100 MCG tablet Take 100 mcg by mouth daily.    Marland Kitchen losartan (COZAAR) 100 MG tablet Take 1 tablet (100 mg total) by mouth daily. 90 tablet 2  . Multiple Vitamin (MULTIVITAMIN) capsule  Take 1 capsule by mouth daily.    . Omega-3 Fatty Acids (FISH OIL) 1000 MG CAPS Take by mouth.    . Turmeric (QC TUMERIC COMPLEX PO) Take by mouth.    . Zinc 50 MG CAPS Take by mouth.    Marland Kitchen omeprazole (PRILOSEC) 40 MG capsule Take 1 capsule (40 mg total) by mouth daily. 90 capsule 2  . sulindac (CLINORIL) 150 MG tablet Take 1 tablet (150 mg total) by mouth daily. 90 tablet 1   No facility-administered medications prior to visit.    Allergies  Allergen Reactions  . Codeine     Other reaction(s): dry mouth  . Elemental Sulfur     Codine   . Sulfa Antibiotics Nausea And Vomiting    Other reaction(s): Unknown Reaction Other reaction(s): Unknown Reaction     Review of Systems REFER TO HPI FOR PERTINENT POSITIVES AND NEGATIVES     Objective:    Physical Exam Vitals and nursing note reviewed.  Constitutional:      Appearance: Normal  appearance.  Musculoskeletal:       Hands:  Neurological:     Mental Status: He is alert.     Sensory: Sensation is intact.     BP 130/85   Pulse 84   Temp 98 F (36.7 C)   Ht 6\' 2"  (1.88 m)   Wt 216 lb (98 kg)   SpO2 97%   BMI 27.73 kg/m  Wt Readings from Last 3 Encounters:  10/01/20 216 lb (98 kg)  09/09/20 223 lb (101.2 kg)  09/02/20 218 lb 6.1 oz (99.1 kg)    Health Maintenance Due  Topic Date Due  . HIV Screening  Never done  . COLONOSCOPY (Pts 45-82yrs Insurance coverage will need to be confirmed)  Never done    Assessment & Plan:   Problem List Items Addressed This Visit   None   Visit Diagnoses    Dupuytren's contracture of left hand    -  Primary   Relevant Orders   Ambulatory referral to Hand Surgery   Screening for colon cancer       Relevant Orders   Cologuard   Need for vaccination against Streptococcus pneumoniae using pneumococcal conjugate vaccine 13       Relevant Orders   Pneumococcal conjugate vaccine 13-valent (Completed)     1. Dupuytren's contracture of left hand This looks like he has an early Dupuytren's contracture.  As his father had a lot of trouble with this, patient would like to see a specialist right away.  I will send referral to hand specialist to see if they can do an injection for early intervention.  In the meantime he can try ice, gentle massage, and Voltaren gel to the area.  2. Screening for colon cancer Form completed for Cologuard and faxed today.  3. Need for vaccination against Streptococcus pneumoniae using pneumococcal conjugate vaccine 13 This vaccination was updated in our office today.  Patient tolerated well no problems.  Handout provided on vaccine information.  This note was prepared with assistance of Systems analyst. Occasional wrong-word or sound-a-like substitutions may have occurred due to the inherent limitations of voice recognition software.  This visit occurred during the  SARS-CoV-2 public health emergency.  Safety protocols were in place, including screening questions prior to the visit, additional usage of staff PPE, and extensive cleaning of exam room while observing appropriate contact time as indicated for disinfecting solutions.     Lylian Sanagustin M Richelle Glick, PA-C

## 2020-10-09 DIAGNOSIS — M19042 Primary osteoarthritis, left hand: Secondary | ICD-10-CM | POA: Diagnosis not present

## 2020-10-09 DIAGNOSIS — M720 Palmar fascial fibromatosis [Dupuytren]: Secondary | ICD-10-CM | POA: Diagnosis not present

## 2020-10-23 ENCOUNTER — Telehealth: Payer: Self-pay

## 2020-10-23 NOTE — Telephone Encounter (Signed)
Notified patient forms will be re faxed.

## 2020-10-23 NOTE — Telephone Encounter (Signed)
Pt states he has not received his cologuard test.

## 2020-10-29 DIAGNOSIS — M9903 Segmental and somatic dysfunction of lumbar region: Secondary | ICD-10-CM | POA: Diagnosis not present

## 2020-10-29 DIAGNOSIS — M9902 Segmental and somatic dysfunction of thoracic region: Secondary | ICD-10-CM | POA: Diagnosis not present

## 2020-10-29 DIAGNOSIS — M5135 Other intervertebral disc degeneration, thoracolumbar region: Secondary | ICD-10-CM | POA: Diagnosis not present

## 2020-10-29 DIAGNOSIS — M5417 Radiculopathy, lumbosacral region: Secondary | ICD-10-CM | POA: Diagnosis not present

## 2020-10-29 DIAGNOSIS — M5136 Other intervertebral disc degeneration, lumbar region: Secondary | ICD-10-CM | POA: Diagnosis not present

## 2020-10-29 DIAGNOSIS — M9904 Segmental and somatic dysfunction of sacral region: Secondary | ICD-10-CM | POA: Diagnosis not present

## 2020-11-05 DIAGNOSIS — Z1211 Encounter for screening for malignant neoplasm of colon: Secondary | ICD-10-CM | POA: Diagnosis not present

## 2020-11-05 LAB — COLOGUARD: Cologuard: NEGATIVE

## 2020-11-12 ENCOUNTER — Encounter: Payer: Self-pay | Admitting: Physician Assistant

## 2020-11-12 LAB — COLOGUARD
COLOGUARD: NEGATIVE
Cologuard: NEGATIVE

## 2020-11-19 ENCOUNTER — Ambulatory Visit: Payer: Medicare HMO | Admitting: Dermatology

## 2020-11-19 ENCOUNTER — Other Ambulatory Visit: Payer: Self-pay

## 2020-11-19 ENCOUNTER — Encounter: Payer: Self-pay | Admitting: Dermatology

## 2020-11-19 DIAGNOSIS — C44519 Basal cell carcinoma of skin of other part of trunk: Secondary | ICD-10-CM | POA: Diagnosis not present

## 2020-11-19 DIAGNOSIS — C4491 Basal cell carcinoma of skin, unspecified: Secondary | ICD-10-CM

## 2020-11-19 NOTE — Patient Instructions (Signed)

## 2020-11-24 ENCOUNTER — Encounter: Payer: Self-pay | Admitting: Dermatology

## 2020-11-24 NOTE — Telephone Encounter (Signed)
Phone call to patient to let him know that his lesion is healing fine and does not look infected.

## 2020-12-03 ENCOUNTER — Encounter: Payer: Self-pay | Admitting: Dermatology

## 2020-12-03 NOTE — Progress Notes (Signed)
   New Patient   Subjective  Scott Montoya is a 65 y.o. male who presents for the following: New Patient (Initial Visit) (Patient here today for treatment of a [place] on his left upper back that was biopsied by [facility]. No personal history of atypical moles or melanoma, no family history of atypical moles, melanoma or non mole skin cancer.).  BCC Location: Upper back Duration:  Quality:  Associated Signs/Symptoms: Modifying Factors:  Severity:  Timing: Treatment Context:    The following portions of the chart were reviewed this encounter and updated as appropriate:  Tobacco  Allergies  Meds  Problems  Med Hx  Surg Hx  Fam Hx      Objective  Well appearing patient in no apparent distress; mood and affect are within normal limits. Objective  Left Upper Back: Biopsy site identified by patient and family obvious to the nurse and to me.  Biopsy dated September 09, 2020 interpreted to be a superficial basal cell carcinoma; biopsy dimensions were 4 x 5 x 6 mm indicating a deep punch biopsy.    All skin waist up examined.   Assessment & Plan  Basal cell carcinoma (BCC), unspecified site Left Upper Back  Destruction of lesion Complexity: simple   Destruction method: electrodesiccation and curettage   Informed consent: discussed and consent obtained   Timeout:  patient name, date of birth, surgical site, and procedure verified Anesthesia: the lesion was anesthetized in a standard fashion   Anesthetic:  1% lidocaine w/ epinephrine 1-100,000 local infiltration Curettage performed in three different directions: Yes   Curettage cycles:  3 Lesion length (cm):  1.6 Lesion width (cm):  1.6 Margin per side (cm):  0 Final wound size (cm):  1.6 Hemostasis achieved with:  aluminum chloride and ferric subsulfate Outcome: patient tolerated procedure well with no complications   Post-procedure details: wound care instructions given   Additional details:  Wound innoculated with 5  fluorouracil solution.

## 2020-12-07 ENCOUNTER — Telehealth: Payer: Self-pay | Admitting: Pulmonary Disease

## 2020-12-07 NOTE — Telephone Encounter (Signed)
Spoke with pt and attempted to explain that [facility] is currently close to 9 months out on C Pap machines. Pt stated understanding. Nothing further needed at this time.

## 2020-12-11 DIAGNOSIS — G4733 Obstructive sleep apnea (adult) (pediatric): Secondary | ICD-10-CM | POA: Diagnosis not present

## 2020-12-22 ENCOUNTER — Telehealth: Payer: Self-pay

## 2020-12-22 NOTE — Telephone Encounter (Signed)
Patient called in to update he received his second [place] booster on 12/10/20. Prevnar 20 was administered on 12/10/20

## 2020-12-24 NOTE — Telephone Encounter (Signed)
Chart updated

## 2021-01-09 DIAGNOSIS — G4733 Obstructive sleep apnea (adult) (pediatric): Secondary | ICD-10-CM | POA: Diagnosis not present

## 2021-01-11 DIAGNOSIS — G4733 Obstructive sleep apnea (adult) (pediatric): Secondary | ICD-10-CM | POA: Diagnosis not present

## 2021-01-21 ENCOUNTER — Encounter: Payer: Self-pay | Admitting: Primary Care

## 2021-01-21 ENCOUNTER — Other Ambulatory Visit: Payer: Self-pay

## 2021-01-21 ENCOUNTER — Ambulatory Visit: Payer: Medicare HMO | Admitting: Primary Care

## 2021-01-21 DIAGNOSIS — G4733 Obstructive sleep apnea (adult) (pediatric): Secondary | ICD-10-CM | POA: Diagnosis not present

## 2021-01-21 NOTE — Assessment & Plan Note (Signed)
-   Home sleep test showed mild OSA, average AHI 13/hr. He underwent CPAP titration in December 2021 and received auto CPAP in May-June 2022. He is 100% compliant with use, using on average 6 hours 6 mins a night. Pressure 10-15cm h20 (12.5cm h20); residual AHI 1.7. He is interested in automatic CPAP cleaner. FU in 6 months or sooner if needed.

## 2021-01-21 NOTE — Patient Instructions (Addendum)
Nice meeting you today Scott Montoya job wearing your CPAP. No changes today  Recommendations: Continue to wear CPAP every night for 4-6 hours or longer Do not drive if experiencing excessive daytime fatigue or somnolence You can get any automatic CPAP cleaner that fits your budget, there is not a particular one that our office recommends   Follow-up: 6 months with Dr. Elsworth Soho or sooner if needed    CPAP and BPAP Information CPAP and BPAP (also called BiPAP) are methods that use air pressure to keep your airways open and to help you breathe well. CPAP and BPAP use different amounts of pressure. Your health care provider will tell you whether CPAP or BPAP would be more helpful for you. CPAP stands for "continuous positive airway pressure." With CPAP, the amount of pressure stays the same while you breathe in (inhale) and out (exhale). BPAP stands for "bi-level positive airway pressure." With BPAP, the amount of pressure will be higher when you inhale and lower when you exhale. This allows you to take larger breaths. CPAP or BPAP may be used in the hospital, or your health care provider may want you to use it at home. You may need to have a sleep study before your healthcare provider can order a machine for you to use at home. What are the advantages? CPAP or BPAP can be helpful if you have: Sleep apnea. Chronic obstructive pulmonary disease (COPD). Heart failure. Medical conditions that cause muscle weakness, including muscular dystrophy or amyotrophic lateral sclerosis (ALS). Other problems that cause breathing to be shallow, weak, abnormal, or difficult. CPAP and BPAP are most commonly used for obstructive sleep apnea (OSA) to keepthe airways from collapsing when the muscles relax during sleep. What are the risks? Generally, this is a safe treatment. However, problems may occur, including: Irritated skin or skin sores if the mask does not fit properly. Dry or stuffy nose or  nosebleeds. Dry mouth. Feeling gassy or bloated. Sinus or lung infection if the equipment is not cleaned properly. When should CPAP or BPAP be used? In most cases, the mask only needs to be worn during sleep. Generally, the mask needs to be worn throughout the night and during any daytime naps. People with certain medical conditions may also need to wear the mask at other times, such as when they are awake. Follow instructions from your health care providerabout when to use the machine. What happens during CPAP or BPAP?  Both CPAP and BPAP are provided by a small machine with a flexible plastic tube that attaches to a plastic mask that you wear. Air is blown through the mask into your nose or mouth. The amount of pressure that is used to blow the air can be adjusted on the machine. Your health care provider will set the pressuresetting and help you find the best mask for you. Tips for using the mask Because the mask needs to be snug, some people feel trapped or closed-in (claustrophobic) when first using the mask. If you feel this way, you may need to get used to the mask. One way to do this is to hold the mask loosely over your nose or mouth and then gradually apply the mask more snugly. You can also gradually increase the amount of time that you use the mask. Masks are available in various types and sizes. If your mask does not fit well, talk with your health care provider about getting a different one. Some common types of masks include: Full face masks,  which fit over the mouth and nose. Nasal masks, which fit over the nose. Nasal pillow or prong masks, which fit into the nostrils. If you are using a mask that fits over your nose and you tend to breathe through your mouth, a chin strap may be applied to help keep your mouth closed. Use a skin barrier to protect your skin as told by your health care provider. Some CPAP and BPAP machines have alarms that may sound if the mask comes off or develops  a leak. If you have trouble with the mask, it is very important that you talk with your health care provider about finding a way to make the mask easier to tolerate. Do not stop using the mask. There could be a negative impact on your health if you stop using the mask. Tips for using the machine Place your CPAP or BPAP machine on a secure table or stand near an electrical outlet. Know where the on/off switch is on the machine. Follow instructions from your health care provider about how to set the pressure on your machine and when you should use it. Do not eat or drink while the CPAP or BPAP machine is on. Food or fluids could get pushed into your lungs by the pressure of the CPAP or BPAP. For home use, CPAP and BPAP machines can be rented or purchased through home health care companies. Many different brands of machines are available. Renting a machine before purchasing may help you find out which particular machine works well for you. Your health insurance company may also decide which machine you may get. Keep the CPAP or BPAP machine and attachments clean. Ask your health care provider for specific instructions. Check the humidifier if you have a dry stuffy nose or nosebleeds. Make sure it is working correctly. Follow these instructions at home: Take over-the-counter and prescription medicines only as told by your health care provider. Ask if you can take sinus medicine if your sinuses are blocked. Do not use any products that contain nicotine or tobacco. These products include cigarettes, chewing tobacco, and vaping devices, such as e-cigarettes. If you need help quitting, ask your health care provider. Keep all follow-up visits. This is important. Contact a health care provider if: You have redness or pressure sores on your head, face, mouth, or nose from the mask or head gear. You have trouble using the CPAP or BPAP machine. You cannot tolerate wearing the CPAP or BPAP mask. Someone tells  you that you snore even when wearing your CPAP or BPAP. Get help right away if: You have trouble breathing. You feel confused. Summary CPAP and BPAP are methods that use air pressure to keep your airways open and to help you breathe well. If you have trouble with the mask, it is very important that you talk with your health care provider about finding a way to make the mask easier to tolerate. Do not stop using the mask. There could be a negative impact to your health if you stop using the mask. Follow instructions from your health care provider about when to use the machine. This information is not intended to replace advice given to you by your health care provider. Make sure you discuss any questions you have with your healthcare provider. Document Revised: 06/12/2020 Document Reviewed: 06/12/2020 Elsevier Patient Education  2022 [employer].

## 2021-01-21 NOTE — Progress Notes (Signed)
@Patient  ID: Scott Montoya, male    DOB: Dec 17, 1955, 65 y.o.   MRN: [id number]  Chief Complaint  Patient presents with   Follow-up    Still compliant with CPAP, no complaints    Referring provider: Allwardt, Randa Evens, PA-C  HPI: 65 year old male, former smoker. PMH significant for OSA, COPD. Patient of Dr. Elsworth Soho, last seen on 04/21/20. Hx sleep walking. HST showed mild OSA, AHI 13/hr. He underwent cpap titration study in December 2021. Started on auto CPAP 10-15cm h20,  large size Resmed Full Face Mask AirFit F20 mask.   Previous [facility] pulmonary encounter: 04/21/20- Dr. Elsworth Soho, consult  66 year old presents for evaluation of sleep disordered breathing and parasomnia. He has moved from [place] to [place].  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 He reports that 2 of his kids have OSA and uses CPAP.  He reports loud snoring but no bed partner history is available, he is divorced for many years. He has chronic low back pain and sleeps in a recliner for at least the last 3 years.  He woke up 1 morning and had a laceration on his left eyebrow for which she required 7 sutures.  He does not remember how he got this injury.  No history of sleepwalking in the past or other parasomnias.  He tried an over-the-counter mouthguard without much benefit in the past. Epworth sleepiness score is 7. Bedtime is around 10 PM, sleep latency is minimal, he reports 1-3 nocturnal awakenings including nocturia and is out of bed at 6:30 AM feeling sluggish and tired with dryness of mouth.  He has gained 15 pounds in the last 2 years. There is no history suggestive of cataplexy, sleep paralysis or parasomnias   He reports right sciatica for which he has seen an acupuncturist and chiropractor.  He quit smoking 4 years ago.  Drinks a 2 ounces of vodkas a night Every day   01/21/2021- Interim hx  Patient presents today for 6-8 month follow-up. He is  compliant with CPAP use. He is doing well, no acute complaints. He has not noticed any particular benefit in his sleep since starting CPAP but is tolerating well. He uses large size resmed full face mask. He occasionally will fall asleep on the couch before putting on his CPAP mask. He gets on average 6-7 hours of sleep at night, he still wakes up 2-3 times a night. No sleep walking noted. He is looking into getting an automatic CPAP cleaner as it would be more convenient for him.   Airview download 12/21/20-01/19/21 30/30 days used; 83% > 4 hours Average usage days used 6 hours 6 mins Pressure 10-15cm h20 (12.5cm h20- 95%) Airleaks 39.3L/min (95%) AHI 1.7    Allergies  Allergen Reactions   Codeine     Other reaction(s): dry mouth   Sulfa Antibiotics Nausea And Vomiting    Other reaction(s): Unknown Reaction Other reaction(s): Unknown Reaction     Immunization History  Administered Date(s) Administered   Influenza,inj,Quad PF,6+ Mos 04/21/2020   Moderna Sars-Covid-2 Vaccination 10/24/2019, 11/25/2019   PFIZER(Purple Top)SARS-COV-2 Vaccination 07/03/2020, 12/10/2020   PNEUMOCOCCAL CONJUGATE-20 12/10/2020   Pneumococcal Conjugate-13 10/01/2020   Tdap 02/26/2012    Past Medical History:  Diagnosis Date   Basal cell carcinoma 09/09/2020   sup-left upper back (CX35FU)- PCP did bx   Cataract    COPD (chronic obstructive pulmonary disease) ([place])     Tobacco History: Social History   Tobacco  Use  Smoking Status Former   Pack years: 0.00   Types: Cigarettes  Smokeless Tobacco Never   Counseling given: Not Answered   Outpatient Medications Prior to Visit  Medication Sig Dispense Refill   albuterol (VENTOLIN HFA) 108 (90 Base) MCG/ACT inhaler Inhale 1 puff into the lungs every 6 (six) hours as needed for wheezing or shortness of breath.     amLODipine (NORVASC) 10 MG tablet Take 1 tablet (10 mg total) by mouth daily. 90 tablet 2   aspirin EC 81 MG tablet Take 81 mg by mouth  daily. Swallow whole.     Boswellia-Glucosamine-Vit D (OSTEO BI-FLEX ONE PER DAY PO) Take by mouth.     cholecalciferol (VITAMIN D3) 25 MCG (1000 UNIT) tablet Take 1,000 Units by mouth daily.     cyanocobalamin 100 MCG tablet Take 100 mcg by mouth daily.     losartan (COZAAR) 100 MG tablet Take 1 tablet (100 mg total) by mouth daily. 90 tablet 2   Multiple Vitamin (MULTIVITAMIN) capsule Take 1 capsule by mouth daily.     Omega-3 Fatty Acids (FISH OIL) 1000 MG CAPS Take by mouth.     Turmeric (QC TUMERIC COMPLEX PO) Take by mouth.     Zinc 50 MG CAPS Take by mouth.     No facility-administered medications prior to visit.   Review of Systems  Review of Systems  Constitutional: Negative.   Respiratory: Negative.    Psychiatric/Behavioral: Negative.      Physical Exam  BP 126/82 (BP Location: Left Arm, Patient Position: Sitting, Cuff Size: Normal)   Pulse 74   Temp 98.3 F (36.8 C) (Oral)   Resp 18   Ht 6\' 1"  (1.854 m)   Wt 220 lb 12.8 oz (100.2 kg)   SpO2 98%   BMI 29.13 kg/m  Physical Exam Constitutional:      Appearance: Normal appearance.  HENT:     Head: Normocephalic and atraumatic.     Mouth/Throat:     Comments: Deferred d/t masking Cardiovascular:     Rate and Rhythm: Normal rate and regular rhythm.  Pulmonary:     Effort: Pulmonary effort is normal.     Breath sounds: Normal breath sounds.  Skin:    General: Skin is warm and dry.  Neurological:     General: No focal deficit present.     Mental Status: He is alert and oriented to person, place, and time. Mental status is at baseline.  Psychiatric:        Mood and Affect: Mood normal.        Thought Content: Thought content normal.        Judgment: Judgment normal.     Lab Results:  CBC    Component Value Date/Time   WBC 5.1 12/30/2019 1124   RBC 4.44 12/30/2019 1124   HGB 15.3 06/24/2020 1450   HCT 45.0 06/24/2020 1450   PLT 188.0 12/30/2019 1124   MCV 95.7 12/30/2019 1124   MCHC 35.0 12/30/2019  1124   RDW 13.3 12/30/2019 1124   LYMPHSABS 1.0 12/30/2019 1124   MONOABS 0.6 12/30/2019 1124   EOSABS 0.1 12/30/2019 1124   BASOSABS 0.1 12/30/2019 1124    BMET    Component Value Date/Time   NA 140 06/24/2020 1450   K 4.0 06/24/2020 1450   CL 101 06/24/2020 1450   CO2 27 12/30/2019 1124   GLUCOSE 92 06/24/2020 1450   BUN 17 06/24/2020 1450   CREATININE 0.90 06/24/2020 1450   CALCIUM  9.3 12/30/2019 1124    BNP No results found for: BNP  ProBNP No results found for: PROBNP  Imaging: No results found.   Assessment & Plan:   OSA (obstructive sleep apnea) - Home sleep test showed mild OSA, average AHI 13/hr. He underwent CPAP titration in December 2021 and received auto CPAP in May-June 2022. He is 100% compliant with use, using on average 6 hours 6 mins a night. Pressure 10-15cm h20 (12.5cm h20); residual AHI 1.7. He is interested in automatic CPAP cleaner. FU in 6 months or sooner if needed.    Martyn Ehrich, NP 01/21/2021

## 2021-02-09 DIAGNOSIS — G4733 Obstructive sleep apnea (adult) (pediatric): Secondary | ICD-10-CM | POA: Diagnosis not present

## 2021-02-10 DIAGNOSIS — G4733 Obstructive sleep apnea (adult) (pediatric): Secondary | ICD-10-CM | POA: Diagnosis not present

## 2021-03-11 ENCOUNTER — Ambulatory Visit: Payer: Medicare HMO

## 2021-03-11 ENCOUNTER — Other Ambulatory Visit: Payer: Medicare HMO

## 2021-03-12 ENCOUNTER — Ambulatory Visit: Payer: Medicare HMO

## 2021-03-12 ENCOUNTER — Encounter: Payer: Medicare HMO | Admitting: Physician Assistant

## 2021-03-12 ENCOUNTER — Other Ambulatory Visit: Payer: Medicare HMO

## 2021-03-13 DIAGNOSIS — G4733 Obstructive sleep apnea (adult) (pediatric): Secondary | ICD-10-CM | POA: Diagnosis not present

## 2021-03-15 ENCOUNTER — Telehealth: Payer: Self-pay | Admitting: Primary Care

## 2021-03-15 DIAGNOSIS — G4733 Obstructive sleep apnea (adult) (pediatric): Secondary | ICD-10-CM

## 2021-03-15 NOTE — Telephone Encounter (Signed)
Order placed to [facility] for cpap supplies.  Patient is aware and voiced his understanding.  Nothing further needed.

## 2021-03-16 ENCOUNTER — Telehealth: Payer: Self-pay | Admitting: Primary Care

## 2021-03-16 NOTE — Telephone Encounter (Signed)
Spoke to [place].  We did not receive this yet.  Terri re-faxing to Chan's fax#

## 2021-03-16 NOTE — Telephone Encounter (Signed)
Noted.   Will route to [facility] team to see if they have this form.

## 2021-03-16 NOTE — Telephone Encounter (Signed)
Pt anxiously called regarding cmn. States he called [facility] and they state they are still waiting on the cmn. Informed pt that according to the previous encounter, we have not receive the cmn from [place] yet.  Pt is requesting to be called once CMN is signed and sent to [place]. [phone]

## 2021-03-16 NOTE — Telephone Encounter (Signed)
Terri from [place] checking on the [employer] faxed 03/15/2021. Needs faxed back to [place]. Phone number is [phone]. Fax number is [phone].

## 2021-03-18 ENCOUNTER — Ambulatory Visit (INDEPENDENT_AMBULATORY_CARE_PROVIDER_SITE_OTHER): Payer: Medicare HMO

## 2021-03-18 ENCOUNTER — Other Ambulatory Visit: Payer: Self-pay

## 2021-03-18 VITALS — BP 118/78 | HR 81 | Temp 97.4°F | Wt 215.8 lb

## 2021-03-18 DIAGNOSIS — Z0000 Encounter for general adult medical examination without abnormal findings: Secondary | ICD-10-CM | POA: Diagnosis not present

## 2021-03-18 NOTE — Progress Notes (Addendum)
Subjective:   Scott Montoya is a 65 y.o. male who presents for Medicare Annual/Subsequent preventive examination.  Review of Systems     Cardiac Risk Factors include: advanced age (>60mn, >>45women);hypertension;male gender;dyslipidemia     Objective:    Today's Vitals   03/18/21 0816  BP: 118/78  Pulse: 81  Temp: (!) 97.4 F (36.3 C)  SpO2: 92%  Weight: 215 lb 12.8 oz (97.9 kg)   Body mass index is 28.47 kg/m.  Advanced Directives 03/18/2021 06/24/2020 06/19/2020  Does Patient Have a Medical Advance Directive? No No No  Does patient want to make changes to medical advance directive? Yes (MAU/Ambulatory/Procedural Areas - Information given) - -  Would patient like information on creating a medical advance directive? - No - Guardian declined No - Patient declined    Current Medications (verified) Outpatient Encounter Medications as of 03/18/2021  Medication Sig   albuterol (VENTOLIN HFA) 108 (90 Base) MCG/ACT inhaler Inhale 1 puff into the lungs every 6 (six) hours as needed for wheezing or shortness of breath.   amLODipine (NORVASC) 10 MG tablet Take 1 tablet (10 mg total) by mouth daily.   aspirin EC 81 MG tablet Take 81 mg by mouth daily. Swallow whole.   Boswellia-Glucosamine-Vit D (OSTEO BI-FLEX ONE PER DAY PO) Take by mouth.   cholecalciferol (VITAMIN D3) 25 MCG (1000 UNIT) tablet Take 1,000 Units by mouth daily.   cyanocobalamin 100 MCG tablet Take 100 mcg by mouth daily.   losartan (COZAAR) 100 MG tablet Take 1 tablet (100 mg total) by mouth daily.   Multiple Vitamin (MULTIVITAMIN) capsule Take 1 capsule by mouth daily. Occuvite   Omega-3 Fatty Acids (FISH OIL) 1000 MG CAPS Take by mouth.   Turmeric (QC TUMERIC COMPLEX PO) Take by mouth.   Zinc 50 MG CAPS Take by mouth.   PFIZER-BIONT COVID-19 VAC-TRIS SUSP injection    PREVNAR 20 0.5 ML injection    No facility-administered encounter medications on file as of 03/18/2021.    Allergies (verified) Codeine and Sulfa  antibiotics   History: Past Medical History:  Diagnosis Date   Basal cell carcinoma 09/09/2020   sup-left upper back (CX35FU)- PCP did bx   Cataract    COPD (chronic obstructive pulmonary disease) ([facility]    Past Surgical History:  Procedure Laterality Date   EYE SURGERY     foot heel repair  2000   IR FL GUIDED LOC OF NEEDLE/CATH TIP FOR SPINAL INJECTION LT     ORBITAL RECONSTRUCTION  1994   ROTATOR CUFF REPAIR Right 2018   History reviewed. No pertinent family history. Social History   Socioeconomic History   Marital status: Divorced    Spouse name: Not on file   Number of children: Not on file   Years of education: Not on file   Highest education level: Not on file  Occupational History   Not on file  Tobacco Use   Smoking status: Former    Types: Cigarettes   Smokeless tobacco: Never  Vaping Use   Vaping Use: Some days   Devices: Juul  Substance and Sexual Activity   Alcohol use: Yes    Alcohol/week: 28.0 standard drinks    Types: 14 Glasses of wine, 14 Cans of beer per week   Drug use: Not Currently    Types: Marijuana   Sexual activity: Yes    Birth control/protection: None  Other Topics Concern   Not on file  Social History Narrative   Not on file  Social Determinants of Health   Financial Resource Strain: Low Risk    Difficulty of Paying Living Expenses: Not hard at all  Food Insecurity: No Food Insecurity   Worried About Charity fundraiser in the Last Year: Never true   [place] in the Last Year: Never true  Transportation Needs: No Transportation Needs   Lack of Transportation (Medical): No   Lack of Transportation (Non-Medical): No  Physical Activity: Sufficiently Active   Days of Exercise per Week: 5 days   Minutes of Exercise per Session: 40 min  Stress: No Stress Concern Present   Feeling of Stress : Not at all  Social Connections: Socially Isolated   Frequency of Communication with Friends and Family: Twice a week   Frequency of  Social Gatherings with Friends and Family: Three times a week   Attends Religious Services: Never   Active Member of Clubs or Organizations: No   Attends Music therapist: Never   Marital Status: Divorced    Tobacco Counseling Counseling given: Not Answered   Clinical Intake:  Pre-visit preparation completed: Yes  Pain : No/denies pain     BMI - recorded: 28.47 Nutritional Status: BMI 25 -29 Overweight Nutritional Risks: None Diabetes: No  How often do you need to have someone help you when you read instructions, pamphlets, or other written materials from your doctor or pharmacy?: 1 - Never  Diabetic?No  Interpreter Needed?: No  Information entered by :: Charlott Rakes, LPN   Activities of Daily Living In your present state of health, do you have any difficulty performing the following activities: 03/18/2021  Hearing? N  Vision? N  Difficulty concentrating or making decisions? N  Walking or climbing stairs? N  Dressing or bathing? N  Doing errands, shopping? N  Preparing Food and eating ? N  Using the Toilet? N  In the past six months, have you accidently leaked urine? N  Do you have problems with loss of bowel control? N  Managing your Medications? N  Managing your Finances? N  Housekeeping or managing your Housekeeping? N  Some recent data might be hidden    Patient Care Team: Allwardt, Randa Evens, PA-C as PCP - General (Physician Assistant) Lavonna Monarch, MD as Consulting Physician (Dermatology)  Indicate any recent Medical Services you may have received from other than [facility] providers in the past year (date may be approximate).     Assessment:   This is a routine wellness examination for Scott Montoya.  Hearing/Vision screen Hearing Screening - Comments:: Pt denies any hearing issues  Vision Screening - Comments:: Pt followed up with provider in [place] unsure of name   Dietary issues and exercise activities discussed: Current Exercise  Habits: Home exercise routine, Type of exercise: Other - see comments (water aerobic), Time (Minutes): 45, Frequency (Times/Week): 5, Weekly Exercise (Minutes/Week): 225   Goals Addressed             This Visit's Progress    Patient Stated       None at this time       Depression Screen PHQ 2/9 Scores 03/18/2021 12/30/2019  PHQ - 2 Score 0 0    Fall Risk Fall Risk  03/18/2021 12/30/2019  Falls in the past year? 1 0  Number falls in past yr: 1 0  Injury with Fall? 1 0  Comment left eye suture -  Risk for fall due to : Impaired vision -  Follow up Falls prevention discussed -  FALL RISK PREVENTION PERTAINING TO THE HOME:  Any stairs in or around the home? Yes  If so, are there any without handrails? No  Home free of loose throw rugs in walkways, pet beds, electrical cords, etc? Yes  Adequate lighting in your home to reduce risk of falls? Yes   ASSISTIVE DEVICES UTILIZED TO PREVENT FALLS:  Life alert? No  Use of a cane, walker or w/c? No  Grab bars in the bathroom? Yes  Shower chair or bench in shower? No  Elevated toilet seat or a handicapped toilet? No   TIMED UP AND GO:  Was the test performed? Yes .  Length of time to ambulate 10 feet: 10 sec.   Gait steady and fast without use of assistive device  Cognitive Function:     6CIT Screen 03/18/2021  What Year? 0 points  What month? 0 points  What time? 0 points  Count back from 20 0 points  Months in reverse 0 points  Repeat phrase 0 points  Total Score 0    Immunizations Immunization History  Administered Date(s) Administered   Influenza,inj,Quad PF,6+ Mos 04/21/2020   Moderna Sars-Covid-2 Vaccination 10/24/2019, 11/25/2019   PFIZER(Purple Top)SARS-COV-2 Vaccination 07/03/2020, 12/10/2020   PNEUMOCOCCAL CONJUGATE-20 12/10/2020   Pneumococcal Conjugate-13 10/01/2020   Tdap 02/26/2012    TDAP status: Up to date  Flu Vaccine status: Due, Education has been provided regarding the importance of this  vaccine. Advised may receive this vaccine at local pharmacy or Health Dept. Aware to provide a copy of the vaccination record if obtained from local pharmacy or Health Dept. Verbalized acceptance and understanding.  Pneumococcal vaccine status: Due, Education has been provided regarding the importance of this vaccine. Advised may receive this vaccine at local pharmacy or Health Dept. Aware to provide a copy of the vaccination record if obtained from local pharmacy or Health Dept. Verbalized acceptance and understanding.  Covid-19 vaccine status: Completed vaccines  Qualifies for Shingles Vaccine? Yes   Zostavax completed No   Shingrix Completed?: No.    Education has been provided regarding the importance of this vaccine. Patient has been advised to call insurance company to determine out of pocket expense if they have not yet received this vaccine. Advised may also receive vaccine at local pharmacy or Health Dept. Verbalized acceptance and understanding.  Screening Tests Health Maintenance  Topic Date Due   HIV Screening  Never done   Zoster Vaccines- Shingrix (1 of 2) Never done   INFLUENZA VACCINE  02/15/2021   COVID-19 Vaccine (5 - Booster) 04/12/2021   PNA vac Low Risk Adult (2 of 2 - PPSV23) 12/10/2021   TETANUS/TDAP  02/25/2022   COLONOSCOPY (Pts 45-66yr Insurance coverage will need to be confirmed)  11/13/2030   Hepatitis C Screening  Completed   HPV VACCINES  Aged Out    Health Maintenance  Health Maintenance Due  Topic Date Due   HIV Screening  Never done   Zoster Vaccines- Shingrix (1 of 2) Never done   INFLUENZA VACCINE  02/15/2021    Colorectal cancer screening: Type of screening: Colonoscopy. Completed 11/12/20. Repeat every 10 years   Additional Screening:  Hepatitis C Screening: Completed 12/30/19  Vision Screening: Recommended annual ophthalmology exams for early detection of glaucoma and other disorders of the eye. Is the patient up to date with their annual  eye exam?  Yes  Who is the provider or what is the name of the office in which the patient attends annual eye exams? Unsure of  provider name in [place]  If pt is not established with a provider, would they like to be referred to a provider to establish care? No .   Dental Screening: Recommended annual dental exams for proper oral hygiene  Community Resource Referral / Chronic Care Management: CRR required this visit?  No   CCM required this visit?  No      Plan:     I have personally reviewed and noted the following in the patient's chart:   Medical and social history Use of alcohol, tobacco or illicit drugs  Current medications and supplements including opioid prescriptions. Patient is not currently taking opioid prescriptions. Functional ability and status Nutritional status Physical activity Advanced directives List of other physicians Hospitalizations, surgeries, and ER visits in previous 12 months Vitals Screenings to include cognitive, depression, and falls Referrals and appointments  In addition, I have reviewed and discussed with patient certain preventive protocols, quality metrics, and best practice recommendations. A written personalized care plan for preventive services as well as general preventive health recommendations were provided to patient.     Willette Brace, LPN   [id number]   Nurse Notes: None

## 2021-03-18 NOTE — Patient Instructions (Signed)
Scott Montoya , Thank you for taking time to come for your Medicare Wellness Visit. I appreciate your ongoing commitment to your health goals. Please review the following plan we discussed and let me know if I can assist you in the future.   Screening recommendations/referrals: Colonoscopy: Done 11/12/20 repeat in 10 years due 11/13/30 Recommended yearly ophthalmology/optometry visit for glaucoma screening and checkup Recommended yearly dental visit for hygiene and checkup  Vaccinations: Influenza vaccine: Due Pneumococcal vaccine: Due 12/10/21 Tdap vaccine: Done 02/26/12 repeat every 10 years due 02/27/22 Shingles vaccine: Shingrix discussed. Please contact your pharmacy for coverage information.    Covid-19: Completed 4/8, 5/10, 07/03/20 & 12/10/20  Advanced directives: Advance directive discussed with you today. I have provided a copy for you to complete at home and have notarized. Once this is complete please bring a copy in to our office so we can scan it into your chart.  Conditions/risks identified: None at this time  Next appointment: Follow up in one year for your annual wellness visit.   Preventive Care 5 Years and Older, Male Preventive care refers to lifestyle choices and visits with your health care provider that can promote health and wellness. What does preventive care include? A yearly physical exam. This is also called an annual well check. Dental exams once or twice a year. Routine eye exams. Ask your health care provider how often you should have your eyes checked. Personal lifestyle choices, including: Daily care of your teeth and gums. Regular physical activity. Eating a healthy diet. Avoiding tobacco and drug use. Limiting alcohol use. Practicing safe sex. Taking low doses of aspirin every day. Taking vitamin and mineral supplements as recommended by your health care provider. What happens during an annual well check? The services and screenings done by your health  care provider during your annual well check will depend on your age, overall health, lifestyle risk factors, and family history of disease. Counseling  Your health care provider may ask you questions about your: Alcohol use. Tobacco use. Drug use. Emotional well-being. Home and relationship well-being. Sexual activity. Eating habits. History of falls. Memory and ability to understand (cognition). Work and work Statistician. Screening  You may have the following tests or measurements: Height, weight, and BMI. Blood pressure. Lipid and cholesterol levels. These may be checked every 5 years, or more frequently if you are over 80 years old. Skin check. Lung cancer screening. You may have this screening every year starting at age 13 if you have a 30-pack-year history of smoking and currently smoke or have quit within the past 15 years. Fecal occult blood test (FOBT) of the stool. You may have this test every year starting at age 75. Flexible sigmoidoscopy or colonoscopy. You may have a sigmoidoscopy every 5 years or a colonoscopy every 10 years starting at age 63. Prostate cancer screening. Recommendations will vary depending on your family history and other risks. Hepatitis C blood test. Hepatitis B blood test. Sexually transmitted disease (STD) testing. Diabetes screening. This is done by checking your blood sugar (glucose) after you have not eaten for a while (fasting). You may have this done every 1-3 years. Abdominal aortic aneurysm (AAA) screening. You may need this if you are a current or former smoker. Osteoporosis. You may be screened starting at age 62 if you are at high risk. Talk with your health care provider about your test results, treatment options, and if necessary, the need for more tests. Vaccines  Your health care provider may recommend certain  vaccines, such as: Influenza vaccine. This is recommended every year. Tetanus, diphtheria, and acellular pertussis (Tdap, Td)  vaccine. You may need a Td booster every 10 years. Zoster vaccine. You may need this after age 50. Pneumococcal 13-valent conjugate (PCV13) vaccine. One dose is recommended after age 110. Pneumococcal polysaccharide (PPSV23) vaccine. One dose is recommended after age 13. Talk to your health care provider about which screenings and vaccines you need and how often you need them. This information is not intended to replace advice given to you by your health care provider. Make sure you discuss any questions you have with your health care provider. Document Released: 07/31/2015 Document Revised: 03/23/2016 Document Reviewed: 05/05/2015 Elsevier Interactive Patient Education  2017 [employer] Prevention in the Home Falls can cause injuries. They can happen to people of all ages. There are many things you can do to make your home safe and to help prevent falls. What can I do on the outside of my home? Regularly fix the edges of walkways and driveways and fix any cracks. Remove anything that might make you trip as you walk through a door, such as a raised step or threshold. Trim any bushes or trees on the path to your home. Use bright outdoor lighting. Clear any walking paths of anything that might make someone trip, such as rocks or tools. Regularly check to see if handrails are loose or broken. Make sure that both sides of any steps have handrails. Any raised decks and porches should have guardrails on the edges. Have any leaves, snow, or ice cleared regularly. Use sand or salt on walking paths during winter. Clean up any spills in your garage right away. This includes oil or grease spills. What can I do in the bathroom? Use night lights. Install grab bars by the toilet and in the tub and shower. Do not use towel bars as grab bars. Use non-skid mats or decals in the tub or shower. If you need to sit down in the shower, use a plastic, non-slip stool. Keep the floor dry. Clean up any  water that spills on the floor as soon as it happens. Remove soap buildup in the tub or shower regularly. Attach bath mats securely with double-sided non-slip rug tape. Do not have throw rugs and other things on the floor that can make you trip. What can I do in the bedroom? Use night lights. Make sure that you have a light by your bed that is easy to reach. Do not use any sheets or blankets that are too big for your bed. They should not hang down onto the floor. Have a firm chair that has side arms. You can use this for support while you get dressed. Do not have throw rugs and other things on the floor that can make you trip. What can I do in the kitchen? Clean up any spills right away. Avoid walking on wet floors. Keep items that you use a lot in easy-to-reach places. If you need to reach something above you, use a strong step stool that has a grab bar. Keep electrical cords out of the way. Do not use floor polish or wax that makes floors slippery. If you must use wax, use non-skid floor wax. Do not have throw rugs and other things on the floor that can make you trip. What can I do with my stairs? Do not leave any items on the stairs. Make sure that there are handrails on both sides of the stairs  and use them. Fix handrails that are broken or loose. Make sure that handrails are as long as the stairways. Check any carpeting to make sure that it is firmly attached to the stairs. Fix any carpet that is loose or worn. Avoid having throw rugs at the top or bottom of the stairs. If you do have throw rugs, attach them to the floor with carpet tape. Make sure that you have a light switch at the top of the stairs and the bottom of the stairs. If you do not have them, ask someone to add them for you. What else can I do to help prevent falls? Wear shoes that: Do not have high heels. Have rubber bottoms. Are comfortable and fit you well. Are closed at the toe. Do not wear sandals. If you use a  stepladder: Make sure that it is fully opened. Do not climb a closed stepladder. Make sure that both sides of the stepladder are locked into place. Ask someone to hold it for you, if possible. Clearly mark and make sure that you can see: Any grab bars or handrails. First and last steps. Where the edge of each step is. Use tools that help you move around (mobility aids) if they are needed. These include: Canes. Walkers. Scooters. Crutches. Turn on the lights when you go into a dark area. Replace any light bulbs as soon as they burn out. Set up your furniture so you have a clear path. Avoid moving your furniture around. If any of your floors are uneven, fix them. If there are any pets around you, be aware of where they are. Review your medicines with your doctor. Some medicines can make you feel dizzy. This can increase your chance of falling. Ask your doctor what other things that you can do to help prevent falls. This information is not intended to replace advice given to you by your health care provider. Make sure you discuss any questions you have with your health care provider. Document Released: 04/30/2009 Document Revised: 12/10/2015 Document Reviewed: 08/08/2014 Elsevier Interactive Patient Education  2017 [employer].

## 2021-03-23 ENCOUNTER — Telehealth: Payer: Self-pay

## 2021-03-23 NOTE — Telephone Encounter (Signed)
Nurse Assessment Nurse: Ysidro Evert, RN, Levada Dy Date/Time (Eastern Time): 03/23/2021 3:08:13 PM Confirm and document reason for call. If symptomatic, describe symptoms. ---Caller states he is having flank pain on the right side when he moves he gets sharp pain. Does the patient have any new or worsening symptoms? ---Yes Will a triage be completed? ---Yes Related visit to physician within the last 2 weeks? ---No Does the PT have any chronic conditions? (i.e. diabetes, asthma, this includes High risk factors for pregnancy, etc.) ---Yes List chronic conditions. ---copd Is this a behavioral health or substance abuse call? ---No Guidelines Guideline Title Affirmed Question Affirmed Notes Nurse Date/Time (Eastern Time) Flank Pain MODERATE pain (e.g., interferes with normal activities or awakens from sleep) Ysidro Evert, RN, Levada Dy 03/23/2021 3:09:35 PM Disp. Time Eilene Ghazi Time) Disposition Final User 03/23/2021 3:06:22 PM Send to Urgent Queue Mockler, Tiffany PLEASE NOTE: All timestamps contained within this report are represented as [place] Standard Time. CONFIDENTIALTY NOTICE: This fax transmission is intended only for the addressee. It contains information that is legally privileged, confidential or otherwise protected from use or disclosure. If you are not the intended recipient, you are strictly prohibited from reviewing, disclosing, copying using or disseminating any of this information or taking any action in reliance on or regarding this information. If you have received this fax in error, please notify us immediately by telephone so that we can arrange for its return to [place]. Phone: [phone], Toll-Free: [phone], Fax: [phone] Page: 2 of 2 Call Id: [id number] 03/23/2021 3:13:27 PM See PCP within 24 Hours Yes Ysidro Evert, RN, Marin Shutter Disagree/Comply Comply Caller Understands Yes PreDisposition Did not know what to do Care Advice Given Per Guideline SEE PCP WITHIN 24 HOURS: * IF OFFICE  WILL BE OPEN: You need to be examined within the next 24 hours. Call your doctor (or NP/PA) when the office opens and make an appointment. CALL BACK IF: * Fever over 100.4 F (38.0 C) * You become worse CARE ADVICE given per Flank Pain (Adult) guideline. Referrals REFERRED TO PCP OFFIC

## 2021-03-24 ENCOUNTER — Ambulatory Visit (INDEPENDENT_AMBULATORY_CARE_PROVIDER_SITE_OTHER): Payer: Medicare HMO | Admitting: Physician Assistant

## 2021-03-24 ENCOUNTER — Encounter: Payer: Self-pay | Admitting: Physician Assistant

## 2021-03-24 ENCOUNTER — Other Ambulatory Visit: Payer: Self-pay

## 2021-03-24 VITALS — BP 122/66 | HR 74 | Temp 98.5°F | Ht 73.0 in | Wt 214.2 lb

## 2021-03-24 DIAGNOSIS — S2341XA Sprain of ribs, initial encounter: Secondary | ICD-10-CM

## 2021-03-24 MED ORDER — METHYLPREDNISOLONE 4 MG PO TBPK: ORAL_TABLET | ORAL | 0 refills | Status: DC

## 2021-03-24 NOTE — Patient Instructions (Signed)
Take the medrol dose pak as directed. Recheck if worse or no better.

## 2021-03-24 NOTE — Progress Notes (Signed)
Acute Office Visit  Subjective:    Patient ID: Scott Montoya, male    DOB: 14-Feb-1956, 65 y.o.   MRN: [id number]  Chief Complaint  Patient presents with   Back Pain    Right side    HPI Patient is in today for right sided pain x 3 days intermittently.  Worse with movements, such as turning to the left while driving. Sharp, shooting pain. No pain with sitting. No known injury. Exercises at the gym and pool about 5 days out of the week.   No blood in urine. No other urinary changes. No fevers or chills. No N/V. No bowel changes.  No chest pain, abdominal pain, or SOB.   Alleviating factors: Feels better in the pool.  Tx' tried: none.    Past Medical History:  Diagnosis Date   Basal cell carcinoma 09/09/2020   sup-left upper back (CX35FU)- PCP did bx   Cataract    COPD (chronic obstructive pulmonary disease) ([place])     Past Surgical History:  Procedure Laterality Date   EYE SURGERY     foot heel repair  2000   IR [place] GUIDED LOC OF NEEDLE/CATH TIP FOR SPINAL INJECTION LT     ORBITAL RECONSTRUCTION  1994   ROTATOR CUFF REPAIR Right 2018    History reviewed. No pertinent family history.  Social History   Socioeconomic History   Marital status: Divorced    Spouse name: Not on file   Number of children: Not on file   Years of education: Not on file   Highest education level: Not on file  Occupational History   Not on file  Tobacco Use   Smoking status: Former    Types: Cigarettes   Smokeless tobacco: Never  Vaping Use   Vaping Use: Some days   Devices: Juul  Substance and Sexual Activity   Alcohol use: Yes    Alcohol/week: 28.0 standard drinks    Types: 14 Glasses of wine, 14 Cans of beer per week   Drug use: Not Currently    Types: Marijuana   Sexual activity: Yes    Birth control/protection: None  Other Topics Concern   Not on file  Social History Narrative   Not on file   Social Determinants of Health   Financial Resource Strain: Low Risk     Difficulty of Paying Living Expenses: Not hard at all  Food Insecurity: No Food Insecurity   Worried About Charity fundraiser in the Last Year: Never true   Ran Out of Food in the Last Year: Never true  Transportation Needs: No Transportation Needs   Lack of Transportation (Medical): No   Lack of Transportation (Non-Medical): No  Physical Activity: Sufficiently Active   Days of Exercise per Week: 5 days   Minutes of Exercise per Session: 40 min  Stress: No Stress Concern Present   Feeling of Stress : Not at all  Social Connections: Socially Isolated   Frequency of Communication with Friends and Family: Twice a week   Frequency of Social Gatherings with Friends and Family: Three times a week   Attends Religious Services: Never   Active Member of Clubs or Organizations: No   Attends Archivist Meetings: Never   Marital Status: Divorced  Human resources officer Violence: Not At Risk   Fear of Current or Ex-Partner: No   Emotionally Abused: No   Physically Abused: No   Sexually Abused: No    Outpatient Medications Prior to Visit  Medication Sig Dispense  Refill   albuterol (VENTOLIN HFA) 108 (90 Base) MCG/ACT inhaler Inhale 1 puff into the lungs every 6 (six) hours as needed for wheezing or shortness of breath.     amLODipine (NORVASC) 10 MG tablet Take 1 tablet (10 mg total) by mouth daily. 90 tablet 2   aspirin EC 81 MG tablet Take 81 mg by mouth daily. Swallow whole.     Boswellia-Glucosamine-Vit D (OSTEO BI-FLEX ONE PER DAY PO) Take by mouth.     cholecalciferol (VITAMIN D3) 25 MCG (1000 UNIT) tablet Take 1,000 Units by mouth daily.     cyanocobalamin 100 MCG tablet Take 100 mcg by mouth daily.     losartan (COZAAR) 100 MG tablet Take 1 tablet (100 mg total) by mouth daily. 90 tablet 2   Multiple Vitamin (MULTIVITAMIN) capsule Take 1 capsule by mouth daily. Occuvite     Omega-3 Fatty Acids (FISH OIL) 1000 MG CAPS Take by mouth.     PFIZER-BIONT COVID-19 VAC-TRIS SUSP injection       PREVNAR 20 0.5 ML injection      Turmeric (QC TUMERIC COMPLEX PO) Take by mouth.     Zinc 50 MG CAPS Take by mouth.     No facility-administered medications prior to visit.    Allergies  Allergen Reactions   Codeine     Other reaction(s): dry mouth   Sulfa Antibiotics Nausea And Vomiting    Other reaction(s): Unknown Reaction Other reaction(s): Unknown Reaction     Review of Systems REFER TO HPI FOR PERTINENT POSITIVES AND NEGATIVES     Objective:    Physical Exam Vitals and nursing note reviewed.  Constitutional:      General: He is not in acute distress.    Appearance: Normal appearance. He is not toxic-appearing.  HENT:     Head: Normocephalic and atraumatic.     Right Ear: External ear normal.     Left Ear: External ear normal.     Nose: Nose normal.     Mouth/Throat:     Mouth: Mucous membranes are moist.     Pharynx: Oropharynx is clear.  Eyes:     Extraocular Movements: Extraocular movements intact.     Conjunctiva/sclera: Conjunctivae normal.     Pupils: Pupils are equal, round, and reactive to light.  Cardiovascular:     Rate and Rhythm: Normal rate and regular rhythm.     Pulses: Normal pulses.     Heart sounds: Normal heart sounds.  Pulmonary:     Effort: Pulmonary effort is normal.     Breath sounds: Normal breath sounds.  Abdominal:     Tenderness: There is no abdominal tenderness. There is no right CVA tenderness, left CVA tenderness or guarding.  Musculoskeletal:        General: Normal range of motion.     Cervical back: Normal range of motion and neck supple.     Comments: Focal tenderness to palpation along right lateral lower ribs. No rash present. Pain exacerbated with torsion of the chest.   Skin:    General: Skin is warm and dry.  Neurological:     General: No focal deficit present.     Mental Status: He is alert and oriented to person, place, and time.  Psychiatric:        Mood and Affect: Mood normal.        Behavior: Behavior  normal.    BP 122/66   Pulse 74   Temp 98.5 F (36.9 C)   Ht  $'6\' 1"'D$  (1.854 m)   Wt 214 lb 3.2 oz (97.2 kg)   SpO2 94%   BMI 28.26 kg/m  Wt Readings from Last 3 Encounters:  03/24/21 214 lb 3.2 oz (97.2 kg)  03/18/21 215 lb 12.8 oz (97.9 kg)  01/21/21 220 lb 12.8 oz (100.2 kg)    Health Maintenance Due  Topic Date Due   HIV Screening  Never done   Zoster Vaccines- Shingrix (1 of 2) Never done   INFLUENZA VACCINE  02/15/2021    There are no preventive care reminders to display for this patient.   Lab Results  Component Value Date   TSH 1.06 12/30/2019   Lab Results  Component Value Date   WBC 5.1 12/30/2019   HGB 15.3 06/24/2020   HCT 45.0 06/24/2020   MCV 95.7 12/30/2019   PLT 188.0 12/30/2019   Lab Results  Component Value Date   NA 140 06/24/2020   K 4.0 06/24/2020   CO2 27 12/30/2019   GLUCOSE 92 06/24/2020   BUN 17 06/24/2020   CREATININE 0.90 06/24/2020   BILITOT 0.9 12/30/2019   ALKPHOS 55 12/30/2019   AST 25 12/30/2019   ALT 24 12/30/2019   PROT 7.2 12/30/2019   ALBUMIN 4.6 12/30/2019   CALCIUM 9.3 12/30/2019   GFR 79.71 12/30/2019   Lab Results  Component Value Date   CHOL 168 12/30/2019   Lab Results  Component Value Date   HDL 69.00 12/30/2019   Lab Results  Component Value Date   LDLCALC 86 12/30/2019   Lab Results  Component Value Date   TRIG 65.0 12/30/2019   Lab Results  Component Value Date   CHOLHDL 2 12/30/2019   Lab Results  Component Value Date   HGBA1C 5.5 12/30/2019       Assessment & Plan:   Problem List Items Addressed This Visit   None Visit Diagnoses     Sprain of costal cartilage, initial encounter    -  Primary        Meds ordered this encounter  Medications   methylPREDNISolone (MEDROL DOSEPAK) 4 MG TBPK tablet    Sig: Please take per packaging instructions.    Dispense:  21 tablet    Refill:  0    Discussed with patient that I do feel like his pain is musculoskeletal in nature, possibly a  rib sprain.  CVA tenderness was negative and his abdominal exam was benign, therefore I do not think anything more significant is going on.  We will treat conservatively at this time with rest and ice or heat to area.  Medrol Dosepak for additional relief.  Patient is going to keep me updated on how he is doing.   Cozy Veale M Stephens [place]reve, PA-C

## 2021-03-25 ENCOUNTER — Telehealth: Payer: Self-pay

## 2021-03-25 NOTE — Telephone Encounter (Signed)
Patient called in wanting to update [place] that he received his SHINGLES and flu shot yesterday at the pharmacy.

## 2021-03-25 NOTE — Telephone Encounter (Signed)
Immunizations have been updated

## 2021-04-13 ENCOUNTER — Other Ambulatory Visit: Payer: Self-pay

## 2021-04-13 ENCOUNTER — Telehealth: Payer: Self-pay

## 2021-04-13 DIAGNOSIS — G4733 Obstructive sleep apnea (adult) (pediatric): Secondary | ICD-10-CM | POA: Diagnosis not present

## 2021-04-13 DIAGNOSIS — I10 Essential (primary) hypertension: Secondary | ICD-10-CM

## 2021-04-13 MED ORDER — LOSARTAN POTASSIUM 100 MG PO TABS: 100.0000 mg | ORAL_TABLET | Freq: Every day | ORAL | 2 refills | Status: DC

## 2021-04-13 MED ORDER — AMLODIPINE BESYLATE 10 MG PO TABS: 10.0000 mg | ORAL_TABLET | Freq: Every day | ORAL | 2 refills | Status: DC

## 2021-04-13 NOTE — Telephone Encounter (Signed)
  Encourage patient to contact the pharmacy for refills or they can request refills through [facility]:  03/24/21   NEXT APPOINTMENT DATE:  MEDICATION: amLODipine (NORVASC) 10 MG tablet // losartan (COZAAR) 100 MG tablet  Is the patient out of medication? No  PHARMACY: [place], [place]  Let patient know to contact pharmacy at the end of the day to make sure medication is ready.  Please notify patient to allow 48-72 hours to process

## 2021-04-13 NOTE — Telephone Encounter (Signed)
Rx sent in

## 2021-04-28 ENCOUNTER — Ambulatory Visit (INDEPENDENT_AMBULATORY_CARE_PROVIDER_SITE_OTHER): Payer: Medicare HMO | Admitting: Physician Assistant

## 2021-04-28 ENCOUNTER — Encounter: Payer: Self-pay | Admitting: Physician Assistant

## 2021-04-28 ENCOUNTER — Other Ambulatory Visit: Payer: Self-pay

## 2021-04-28 VITALS — BP 134/82 | HR 82 | Temp 98.0°F | Ht 72.0 in | Wt 215.0 lb

## 2021-04-28 DIAGNOSIS — Z789 Other specified health status: Secondary | ICD-10-CM

## 2021-04-28 DIAGNOSIS — Z13220 Encounter for screening for lipoid disorders: Secondary | ICD-10-CM | POA: Diagnosis not present

## 2021-04-28 DIAGNOSIS — Z131 Encounter for screening for diabetes mellitus: Secondary | ICD-10-CM

## 2021-04-28 DIAGNOSIS — G4733 Obstructive sleep apnea (adult) (pediatric): Secondary | ICD-10-CM

## 2021-04-28 DIAGNOSIS — H6122 Impacted cerumen, left ear: Secondary | ICD-10-CM | POA: Diagnosis not present

## 2021-04-28 DIAGNOSIS — Z0000 Encounter for general adult medical examination without abnormal findings: Secondary | ICD-10-CM | POA: Diagnosis not present

## 2021-04-28 DIAGNOSIS — I10 Essential (primary) hypertension: Secondary | ICD-10-CM

## 2021-04-28 LAB — COMPREHENSIVE METABOLIC PANEL
ALT: 24 U/L (ref 0–53)
AST: 21 U/L (ref 0–37)
Albumin: 4.3 g/dL (ref 3.5–5.2)
Alkaline Phosphatase: 50 U/L (ref 39–117)
BUN: 14 mg/dL (ref 6–23)
CO2: 29 mEq/L (ref 19–32)
Calcium: 9.4 mg/dL (ref 8.4–10.5)
Chloride: 104 mEq/L (ref 96–112)
Creatinine, Ser: 0.88 mg/dL (ref 0.40–1.50)
GFR: 90.09 mL/min (ref >60.00)
Glucose, Bld: 99 mg/dL (ref 70–99)
Potassium: 4.4 mEq/L (ref 3.5–5.1)
Sodium: 139 mEq/L (ref 135–145)
Total Bilirubin: 0.6 mg/dL (ref 0.2–1.2)
Total Protein: 7.2 g/dL (ref 6.0–8.3)

## 2021-04-28 LAB — CBC WITH DIFFERENTIAL/PLATELET
Basophils Absolute: 0.1 10*3/uL (ref 0.0–0.1)
Basophils Relative: 1 % (ref 0.0–3.0)
Eosinophils Absolute: 0.1 10*3/uL (ref 0.0–0.7)
Eosinophils Relative: 1.9 % (ref 0.0–5.0)
HCT: 42.8 % (ref 39.0–52.0)
Hemoglobin: 14.6 g/dL (ref 13.0–17.0)
Lymphocytes Relative: 23 % (ref 12.0–46.0)
Lymphs Abs: 1.2 10*3/uL (ref 0.7–4.0)
MCHC: 34.1 g/dL (ref 30.0–36.0)
MCV: 94.1 fl (ref 78.0–100.0)
Monocytes Absolute: 0.6 10*3/uL (ref 0.1–1.0)
Monocytes Relative: 11.2 % (ref 3.0–12.0)
Neutro Abs: 3.3 10*3/uL (ref 1.4–7.7)
Neutrophils Relative %: 62.9 % (ref 43.0–77.0)
Platelets: 203 10*3/uL (ref 150.0–400.0)
RBC: 4.55 Mil/uL (ref 4.22–5.81)
RDW: 13.5 % (ref 11.5–15.5)
WBC: 5.2 10*3/uL (ref 4.0–10.5)

## 2021-04-28 LAB — LIPID PANEL
Cholesterol: 165 mg/dL (ref 0–200)
HDL: 58.5 mg/dL (ref >39.00)
LDL Cholesterol: 95 mg/dL (ref 0–99)
NonHDL: 106.37
Total CHOL/HDL Ratio: 3
Triglycerides: 57 mg/dL (ref 0.0–149.0)
VLDL: 11.4 mg/dL (ref 0.0–40.0)

## 2021-04-28 NOTE — Patient Instructions (Addendum)
Good to see you today!  Labs today, will call call with results. Keep up good work with nutrition and exercise. You need to cut back on alcohol.   Try OTC Debrox ear drops to remove wax from left ear.  Monitor BP at home.  Call if any concerns.

## 2021-04-28 NOTE — Progress Notes (Signed)
Subjective:    Patient ID: Scott Montoya, male    DOB: Nov 15, 1955, 65 y.o.   MRN: [id number]  Chief Complaint  Patient presents with   Annual Exam    HPI Patient is in today for annual check.   Acute concerns: None today  Health maintenance: Lifestyle/ exercise: [employer] 5-7 days per week, swimming  Nutrition: Good, less meat, more greens usually  Mental health: Doing ok  Caffeine: 3 cups coffee on average daily  Sleep: Uses CPAP at night, does get better quality sleep Prostate: Usually gets up one to two times per night Substance use: Drinks nightly Immunizations: UTD, second Shingrix in Nov  Colonoscopy: UTD, next due in 2032    Past Medical History:  Diagnosis Date   Basal cell carcinoma 09/09/2020   sup-left upper back (CX35FU)- PCP did bx   Cataract    COPD (chronic obstructive pulmonary disease) ([place])     Past Surgical History:  Procedure Laterality Date   EYE SURGERY     foot heel repair  2000   IR [place] GUIDED LOC OF NEEDLE/CATH TIP FOR SPINAL INJECTION LT     ORBITAL RECONSTRUCTION  1994   ROTATOR CUFF REPAIR Right 2018    History reviewed. No pertinent family history.  Social History   Tobacco Use   Smoking status: Former    Types: Cigarettes   Smokeless tobacco: Never  Vaping Use   Vaping Use: Some days   Devices: Juul  Substance Use Topics   Alcohol use: Yes    Alcohol/week: 28.0 standard drinks    Types: 14 Glasses of wine, 14 Cans of beer per week   Drug use: Not Currently    Types: Marijuana     Allergies  Allergen Reactions   Codeine     Other reaction(s): dry mouth   Sulfa Antibiotics Nausea And Vomiting    Other reaction(s): Unknown Reaction Other reaction(s): Unknown Reaction     Review of Systems  Constitutional:  Negative for activity change, appetite change and unexpected weight change.  HENT:  Negative for congestion.   Eyes:  Negative for visual disturbance.  Respiratory:  Negative for apnea and shortness of breath.    Cardiovascular:  Negative for chest pain.  Gastrointestinal:  Negative for abdominal pain and blood in stool.  Endocrine: Negative for polydipsia, polyphagia and polyuria.  Genitourinary:  Negative for difficulty urinating.  Musculoskeletal:  Negative for arthralgias.  Skin:  Negative for rash.  Neurological:  Negative for seizures, weakness and headaches.  Psychiatric/Behavioral:  Negative for sleep disturbance and suicidal ideas.        Objective:     BP 134/82   Pulse 82   Temp 98 F (36.7 C)   Ht 6' (1.829 m)   Wt 215 lb (97.5 kg)   SpO2 95%   BMI 29.16 kg/m   Wt Readings from Last 3 Encounters:  04/28/21 215 lb (97.5 kg)  03/24/21 214 lb 3.2 oz (97.2 kg)  03/18/21 215 lb 12.8 oz (97.9 kg)    BP Readings from Last 3 Encounters:  04/28/21 134/82  03/24/21 122/66  03/18/21 118/78     Physical Exam Vitals and nursing note reviewed.  Constitutional:      General: He is not in acute distress.    Appearance: Normal appearance. He is not toxic-appearing.  HENT:     Head: Normocephalic and atraumatic.     Right Ear: Tympanic membrane, ear canal and external ear normal.     Left Ear: Tympanic  membrane and external ear normal. There is impacted cerumen.     Nose: Nose normal.     Mouth/Throat:     Mouth: Mucous membranes are moist.     Pharynx: Oropharynx is clear.  Eyes:     Extraocular Movements: Extraocular movements intact.     Conjunctiva/sclera: Conjunctivae normal.     Pupils: Pupils are equal, round, and reactive to light.  Cardiovascular:     Rate and Rhythm: Normal rate and regular rhythm.     Pulses: Normal pulses.     Heart sounds: Normal heart sounds.  Pulmonary:     Effort: Pulmonary effort is normal.     Breath sounds: Normal breath sounds.  Abdominal:     General: Abdomen is flat. Bowel sounds are normal.     Palpations: Abdomen is soft.     Tenderness: There is no abdominal tenderness.  Musculoskeletal:        General: Normal range of  motion.     Cervical back: Normal range of motion and neck supple.  Skin:    General: Skin is warm and dry.  Neurological:     General: No focal deficit present.     Mental Status: He is alert and oriented to person, place, and time.  Psychiatric:        Mood and Affect: Mood normal.        Behavior: Behavior normal.       Assessment & Plan:   Problem List Items Addressed This Visit       Cardiovascular and Mediastinum   Benign essential hypertension   Relevant Orders   CBC with Differential/Platelet   Comprehensive metabolic panel   Lipid panel     Respiratory   OSA (obstructive sleep apnea)   Relevant Orders   CBC with Differential/Platelet   Comprehensive metabolic panel   Other Visit Diagnoses     Encounter for annual physical exam    -  Primary   Diabetes mellitus screening       Relevant Orders   Comprehensive metabolic panel   Screening for cholesterol level       Relevant Orders   Lipid panel   Left ear impacted cerumen           Age-appropriate screening and counseling performed today. Will check labs and call with results. Preventive measures discussed and printed in AVS for patient. Strong emphasis today on cutting back on alcohol use. He is doing great otherwise keeping up with good diet and exercise habits. Vision / dental annually. Derm annually.    Virgene Tirone M Daneille Desilva, PA-C

## 2021-04-30 DIAGNOSIS — G4733 Obstructive sleep apnea (adult) (pediatric): Secondary | ICD-10-CM | POA: Diagnosis not present

## 2021-05-04 ENCOUNTER — Encounter: Payer: Self-pay | Admitting: Dermatology

## 2021-05-04 ENCOUNTER — Ambulatory Visit: Payer: Medicare HMO | Admitting: Dermatology

## 2021-05-04 ENCOUNTER — Other Ambulatory Visit: Payer: Self-pay

## 2021-05-04 DIAGNOSIS — Z85828 Personal history of other malignant neoplasm of skin: Secondary | ICD-10-CM

## 2021-05-04 DIAGNOSIS — D485 Neoplasm of uncertain behavior of skin: Secondary | ICD-10-CM

## 2021-05-04 DIAGNOSIS — L821 Other seborrheic keratosis: Secondary | ICD-10-CM

## 2021-05-04 DIAGNOSIS — D3611 Benign neoplasm of peripheral nerves and autonomic nervous system of face, head, and neck: Secondary | ICD-10-CM

## 2021-05-04 DIAGNOSIS — L820 Inflamed seborrheic keratosis: Secondary | ICD-10-CM

## 2021-05-04 DIAGNOSIS — Z1283 Encounter for screening for malignant neoplasm of skin: Secondary | ICD-10-CM

## 2021-05-05 ENCOUNTER — Other Ambulatory Visit: Payer: Medicare HMO

## 2021-05-05 ENCOUNTER — Encounter: Payer: Medicare HMO | Admitting: Physician Assistant

## 2021-05-13 ENCOUNTER — Other Ambulatory Visit: Payer: Self-pay

## 2021-05-13 ENCOUNTER — Ambulatory Visit (INDEPENDENT_AMBULATORY_CARE_PROVIDER_SITE_OTHER): Payer: Medicare HMO | Admitting: Physician Assistant

## 2021-05-13 VITALS — BP 147/71 | HR 76 | Temp 97.9°F | Ht 71.5 in | Wt 215.2 lb

## 2021-05-13 DIAGNOSIS — H6122 Impacted cerumen, left ear: Secondary | ICD-10-CM | POA: Diagnosis not present

## 2021-05-13 DIAGNOSIS — G4733 Obstructive sleep apnea (adult) (pediatric): Secondary | ICD-10-CM | POA: Diagnosis not present

## 2021-05-13 NOTE — Progress Notes (Signed)
   Subjective:    Patient ID: Scott Montoya, male    DOB: 07-16-1956, 65 y.o.   MRN: [id number]  No chief complaint on file.   HPI Patient is in today for irrigation of left ear canal. States he tried Debrox at home without success and now feels like he cannot hear out of this ear.   Past Medical History:  Diagnosis Date   Basal cell carcinoma 09/09/2020   sup-left upper back (CX35FU)- PCP did bx   Cataract    COPD (chronic obstructive pulmonary disease) ([place])     Past Surgical History:  Procedure Laterality Date   EYE SURGERY     foot heel repair  2000   IR [place] GUIDED LOC OF NEEDLE/CATH TIP FOR SPINAL INJECTION LT     ORBITAL RECONSTRUCTION  1994   ROTATOR CUFF REPAIR Right 2018    No family history on file.  Social History   Tobacco Use   Smoking status: Former    Types: Cigarettes   Smokeless tobacco: Never  Vaping Use   Vaping Use: Some days   Devices: Juul  Substance Use Topics   Alcohol use: Yes    Alcohol/week: 28.0 standard drinks    Types: 14 Glasses of wine, 14 Cans of beer per week   Drug use: Not Currently    Types: Marijuana     Allergies  Allergen Reactions   Codeine     Other reaction(s): dry mouth   Sulfa Antibiotics Nausea And Vomiting    Other reaction(s): Unknown Reaction Other reaction(s): Unknown Reaction     Review of Systems REFER TO HPI FOR PERTINENT POSITIVES AND NEGATIVES      Objective:     BP (!) 147/71   Pulse 76   Temp 97.9 F (36.6 C)   Ht 5' 11.5" (1.816 m)   Wt 215 lb 3.2 oz (97.6 kg)   SpO2 96%   BMI 29.60 kg/m   Wt Readings from Last 3 Encounters:  05/13/21 215 lb 3.2 oz (97.6 kg)  04/28/21 215 lb (97.5 kg)  03/24/21 214 lb 3.2 oz (97.2 kg)    BP Readings from Last 3 Encounters:  05/13/21 (!) 147/71  04/28/21 134/82  03/24/21 122/66     Physical Exam Vitals and nursing note reviewed.  Constitutional:      Appearance: Normal appearance.  HENT:     Left Ear: There is impacted cerumen.   Neurological:     Mental Status: He is alert.       Assessment & Plan:   Problem List Items Addressed This Visit   None Visit Diagnoses     Left ear impacted cerumen    -  Primary       1. Left ear impacted cerumen Ceruminosis is noted. Removal procedure explained and verbal consent obtained from patient. Wax is removed by syringing and manual debridement from left ear canal. Pt tolerated procedure well. Instructions for home care to prevent wax buildup are given.    Kermit Arnette M Bracken Moffa, PA-C

## 2021-05-19 ENCOUNTER — Encounter: Payer: Self-pay | Admitting: Dermatology

## 2021-05-19 NOTE — Progress Notes (Signed)
   Follow-Up Visit   Subjective  Scott Montoya is a 65 y.o. male who presents for the following: Annual Exam (6 month f/u- no new concerns).  General skin examination, several new spots.  Recheck site of skin cancer on back Location:  Duration:  Quality:  Associated Signs/Symptoms: Modifying Factors:  Severity:  Timing: Context:   Objective  Well appearing patient in no apparent distress; mood and affect are within normal limits. Torso - Posterior (Back) Waist up skin examination: No atypical pigmented lesions.  3 possible nonmelanoma skin cancers will be biopsied  Left Neck Superior 4 mm pearly papule       Left Neck Inferior 4 mm waxy papule       Left Upper Arm - Anterior 3 mm pearly papule       Left Upper Back No sign recurrence    All skin waist up examined.   Assessment & Plan    Encounter for screening for malignant neoplasm of skin Torso - Posterior (Back)  Annual skin examination, encouraged to self examine twice annually.  Neoplasm of uncertain behavior of skin (3) Left Neck Superior  Skin / nail biopsy Type of biopsy: tangential   Informed consent: discussed and consent obtained   Timeout: patient name, date of birth, surgical site, and procedure verified   Procedure prep:  Patient was prepped and draped in usual sterile fashion (Non sterile) Prep type:  Chlorhexidine Anesthesia: the lesion was anesthetized in a standard fashion   Anesthetic:  1% lidocaine w/ epinephrine 1-100,000 local infiltration Instrument used: flexible razor blade   Outcome: patient tolerated procedure well   Post-procedure details: wound care instructions given    Specimen 1 - Surgical pathology Differential Diagnosis: bcc vs scc  Check Margins: No  Left Neck Inferior  Skin / nail biopsy Type of biopsy: tangential   Informed consent: discussed and consent obtained   Timeout: patient name, date of birth, surgical site, and procedure verified    Procedure prep:  Patient was prepped and draped in usual sterile fashion (Non sterile) Prep type:  Chlorhexidine Anesthesia: the lesion was anesthetized in a standard fashion   Anesthetic:  1% lidocaine w/ epinephrine 1-100,000 local infiltration Instrument used: flexible razor blade   Outcome: patient tolerated procedure well   Post-procedure details: wound care instructions given    Specimen 2 - Surgical pathology Differential Diagnosis: bcc vs scc  Check Margins: No  Left Upper Arm - Anterior  Skin / nail biopsy Type of biopsy: tangential   Informed consent: discussed and consent obtained   Timeout: patient name, date of birth, surgical site, and procedure verified   Procedure prep:  Patient was prepped and draped in usual sterile fashion (Non sterile) Prep type:  Chlorhexidine Anesthesia: the lesion was anesthetized in a standard fashion   Anesthetic:  1% lidocaine w/ epinephrine 1-100,000 local infiltration Instrument used: flexible razor blade   Outcome: patient tolerated procedure well   Post-procedure details: wound care instructions given    Specimen 3 - Surgical pathology Differential Diagnosis: bcc vs scc  Check Margins: No  Personal history of skin cancer Left Upper Back  Check as needed for change      I, Lavonna Monarch, MD, have reviewed all documentation for this visit.  The documentation on 05/19/21 for the exam, diagnosis, procedures, and orders are all accurate and complete.

## 2021-06-13 DIAGNOSIS — G4733 Obstructive sleep apnea (adult) (pediatric): Secondary | ICD-10-CM | POA: Diagnosis not present

## 2021-06-18 ENCOUNTER — Telehealth: Payer: Self-pay

## 2021-06-18 ENCOUNTER — Ambulatory Visit
Admission: EM | Admit: 2021-06-18 | Discharge: 2021-06-18 | Disposition: A | Discharge status: Home / Self Care | Payer: Medicare HMO

## 2021-06-18 ENCOUNTER — Emergency Department (HOSPITAL_COMMUNITY)
Admission: EM | Admit: 2021-06-18 | Discharge: 2021-06-18 | Disposition: A | Discharge status: Home / Self Care | Payer: Medicare HMO | Attending: Emergency Medicine | Admitting: Emergency Medicine

## 2021-06-18 ENCOUNTER — Other Ambulatory Visit: Payer: Self-pay

## 2021-06-18 ENCOUNTER — Emergency Department (HOSPITAL_COMMUNITY): Payer: Medicare HMO

## 2021-06-18 DIAGNOSIS — Z7982 Long term (current) use of aspirin: Secondary | ICD-10-CM | POA: Diagnosis not present

## 2021-06-18 DIAGNOSIS — J449 Chronic obstructive pulmonary disease, unspecified: Secondary | ICD-10-CM | POA: Insufficient documentation

## 2021-06-18 DIAGNOSIS — Z85828 Personal history of other malignant neoplasm of skin: Secondary | ICD-10-CM | POA: Insufficient documentation

## 2021-06-18 DIAGNOSIS — I10 Essential (primary) hypertension: Secondary | ICD-10-CM | POA: Diagnosis not present

## 2021-06-18 DIAGNOSIS — R000 Tachycardia, unspecified: Secondary | ICD-10-CM | POA: Diagnosis not present

## 2021-06-18 DIAGNOSIS — Z79899 Other long term (current) drug therapy: Secondary | ICD-10-CM | POA: Diagnosis not present

## 2021-06-18 DIAGNOSIS — Z7901 Long term (current) use of anticoagulants: Secondary | ICD-10-CM | POA: Insufficient documentation

## 2021-06-18 DIAGNOSIS — I499 Cardiac arrhythmia, unspecified: Secondary | ICD-10-CM | POA: Diagnosis not present

## 2021-06-18 DIAGNOSIS — Z87891 Personal history of nicotine dependence: Secondary | ICD-10-CM | POA: Diagnosis not present

## 2021-06-18 DIAGNOSIS — I4892 Unspecified atrial flutter: Secondary | ICD-10-CM | POA: Insufficient documentation

## 2021-06-18 DIAGNOSIS — I4891 Unspecified atrial fibrillation: Secondary | ICD-10-CM | POA: Diagnosis not present

## 2021-06-18 DIAGNOSIS — R002 Palpitations: Secondary | ICD-10-CM | POA: Diagnosis present

## 2021-06-18 LAB — MAGNESIUM: Magnesium: 2 mg/dL (ref 1.7–2.4)

## 2021-06-18 LAB — CBC
HCT: 41.6 % (ref 39.0–52.0)
Hemoglobin: 14.2 g/dL (ref 13.0–17.0)
MCH: 32.6 pg (ref 26.0–34.0)
MCHC: 34.1 g/dL (ref 30.0–36.0)
MCV: 95.4 fL (ref 80.0–100.0)
Platelets: 221 10*3/uL (ref 150–400)
RBC: 4.36 MIL/uL (ref 4.22–5.81)
RDW: 12.4 % (ref 11.5–15.5)
WBC: 6.2 10*3/uL (ref 4.0–10.5)
nRBC: 0 % (ref 0.0–0.2)

## 2021-06-18 LAB — PROTIME-INR
INR: 1 (ref 0.8–1.2)
Prothrombin Time: 13.4 seconds (ref 11.4–15.2)

## 2021-06-18 LAB — BASIC METABOLIC PANEL
Anion gap: 6 (ref 5–15)
BUN: 17 mg/dL (ref 8–23)
CO2: 27 mmol/L (ref 22–32)
Calcium: 8.7 mg/dL — ABNORMAL LOW (ref 8.9–10.3)
Chloride: 105 mmol/L (ref 98–111)
Creatinine, Ser: 0.93 mg/dL (ref 0.61–1.24)
GFR, Estimated: >60 mL/min (ref >60)
Glucose, Bld: 105 mg/dL — ABNORMAL HIGH (ref 70–99)
Potassium: 4 mmol/L (ref 3.5–5.1)
Sodium: 138 mmol/L (ref 135–145)

## 2021-06-18 LAB — TSH: TSH: 0.669 u[IU]/mL (ref 0.350–4.500)

## 2021-06-18 MED ORDER — ETOMIDATE 2 MG/ML IV SOLN
10.0000 mg | Freq: Once | INTRAVENOUS | Status: DC
  Filled 2021-06-18: qty 10

## 2021-06-18 MED ORDER — ETOMIDATE 2 MG/ML IV SOLN
INTRAVENOUS | Status: AC | PRN
  Administered 2021-06-18 (×2): 10 mg via INTRAVENOUS

## 2021-06-18 MED ORDER — HEPARIN BOLUS VIA INFUSION
4000.0000 [IU] | Freq: Once | INTRAVENOUS | Status: AC
  Administered 2021-06-18: 4000 [IU] via INTRAVENOUS

## 2021-06-18 MED ORDER — MIDAZOLAM HCL 2 MG/2ML IJ SOLN
1.0000 mg | Freq: Once | INTRAMUSCULAR | Status: AC
  Administered 2021-06-18: 1 mg via INTRAVENOUS
  Filled 2021-06-18: qty 2

## 2021-06-18 MED ORDER — HEPARIN BOLUS VIA INFUSION: 4000.0000 [IU] | Freq: Once | INTRAVENOUS | Status: DC

## 2021-06-18 MED ORDER — APIXABAN 5 MG PO TABS: 5.0000 mg | ORAL_TABLET | Freq: Two times a day (BID) | ORAL | 0 refills | Status: DC

## 2021-06-18 MED ORDER — METOPROLOL TARTRATE 5 MG/5ML IV SOLN
5.0000 mg | Freq: Once | INTRAVENOUS | Status: DC
  Filled 2021-06-18: qty 5

## 2021-06-18 MED ORDER — APIXABAN 5 MG PO TABS
5.0000 mg | ORAL_TABLET | Freq: Once | ORAL | Status: AC
  Administered 2021-06-18: 5 mg via ORAL
  Filled 2021-06-18: qty 1

## 2021-06-18 NOTE — ED Notes (Signed)
Patient on phone at this time, alert and stable

## 2021-06-18 NOTE — Discharge Instructions (Addendum)

## 2021-06-18 NOTE — ED Triage Notes (Addendum)
Pt states last night his fitbit states he was in afib 11 times. Pt states this happened in September also. He has spoken to his PCP but has no changes. Pt denies SOB, chest pain, and vision changes, but is more fatigued. Pt is ambulatory and verbal (speech is clear). Pt reports heart rate getting up to 145.

## 2021-06-18 NOTE — ED Provider Notes (Signed)
[facility] DEPT Provider Note   CSN: [id number] Arrival date & time: 06/18/21  1420     History Chief Complaint  Patient presents with   Irregular Heart Beat    Scott Montoya is a 65 y.o. male.  Pt presents to the ED today with irregular hr.  Pt said he wears a fit bit and his watch told him that he had 11 episodes of afib over night starting at 0230.  Pt went to UC who sent him here.  EMS gave him a total of 20 mg of Cardizem IV which did not do much to the HR.  Pt said this happened once on 9/11.  He did tell his doctor, but because it only happened once; they were not worried.        Past Medical History:  Diagnosis Date   Basal cell carcinoma 09/09/2020   sup-left upper back (CX35FU)- PCP did bx   Cataract    COPD (chronic obstructive pulmonary disease) ([place])     Patient Active Problem List   Diagnosis Date Noted   OSA (obstructive sleep apnea) 04/21/2020   Osteoarthritis 01/14/2020   Chronic obstructive pulmonary disease ([place]) 10/24/2018   Foraminal stenosis of lumbar region 04/06/2017   Dyslipidemia 07/24/2014   Asymptomatic varicose veins of bilateral lower extremities 01/31/2012   Benign essential hypertension 01/31/2012   Hx of smoking 01/31/2012    Past Surgical History:  Procedure Laterality Date   EYE SURGERY     foot heel repair  2000   IR [place] GUIDED LOC OF NEEDLE/CATH TIP FOR SPINAL INJECTION LT     ORBITAL RECONSTRUCTION  1994   ROTATOR CUFF REPAIR Right 2018       No family history on file.  Social History   Tobacco Use   Smoking status: Former    Types: Cigarettes   Smokeless tobacco: Never  Vaping Use   Vaping Use: Some days   Devices: Juul  Substance Use Topics   Alcohol use: Yes    Alcohol/week: 28.0 standard drinks    Types: 14 Glasses of wine, 14 Cans of beer per week   Drug use: Not Currently    Types: Marijuana    Home Medications Prior to Admission medications   Medication Sig Start Date  End Date Taking? Authorizing Provider  apixaban (ELIQUIS) 5 MG TABS tablet Take 1 tablet (5 mg total) by mouth 2 (two) times daily. 06/18/21 07/18/21 Yes Isla Pence, MD  albuterol (VENTOLIN HFA) 108 (90 Base) MCG/ACT inhaler Inhale 1 puff into the lungs every 6 (six) hours as needed for wheezing or shortness of breath.    [provider]  amLODipine (NORVASC) 10 MG tablet Take 1 tablet (10 mg total) by mouth daily. 04/13/21   Allwardt, Randa Evens, PA-C  aspirin EC 81 MG tablet Take 81 mg by mouth daily. Swallow whole.    [provider]  Boswellia-Glucosamine-Vit D (OSTEO BI-FLEX ONE PER DAY PO) Take by mouth.    [provider]  cholecalciferol (VITAMIN D3) 25 MCG (1000 UNIT) tablet Take 1,000 Units by mouth daily.    [provider]  cyanocobalamin 100 MCG tablet Take 100 mcg by mouth daily.    [provider]  losartan (COZAAR) 100 MG tablet Take 1 tablet (100 mg total) by mouth daily. 04/13/21   Allwardt, Alyssa M, PA-C  methylPREDNISolone (MEDROL DOSEPAK) 4 MG TBPK tablet Please take per packaging instructions. 03/24/21   Allwardt, Randa Evens, PA-C  Multiple Vitamin (MULTIVITAMIN) capsule  Take 1 capsule by mouth daily. Occuvite    [provider]  Omega-3 Fatty Acids (FISH OIL) 1000 MG CAPS Take by mouth.    [provider]  PFIZER-BIONT COVID-19 VAC-TRIS SUSP injection  12/10/20   [provider]  PREVNAR 20 0.5 ML injection  12/10/20   [provider]  Turmeric (QC TUMERIC COMPLEX PO) Take by mouth.    [provider]  Zinc 50 MG CAPS Take by mouth.    [provider]    Allergies    Codeine and Sulfa antibiotics  Review of Systems   Review of Systems  Cardiovascular:  Positive for palpitations.  All other systems reviewed and are negative.  Physical Exam Updated Vital Signs BP (!) 127/93   Pulse 82   Temp 98.8 F (37.1 C) (Oral)   Resp 16   Ht 6\' 1"  (1.854 m)   Wt 98 kg   SpO2 97%    BMI 28.50 kg/m   Physical Exam Vitals and nursing note reviewed.  Constitutional:      Appearance: Normal appearance.  HENT:     Head: Normocephalic and atraumatic.     Right Ear: External ear normal.     Left Ear: External ear normal.     Nose: Nose normal.     Mouth/Throat:     Mouth: Mucous membranes are moist.     Pharynx: Oropharynx is clear.  Eyes:     Extraocular Movements: Extraocular movements intact.     Conjunctiva/sclera: Conjunctivae normal.     Pupils: Pupils are equal, round, and reactive to light.  Cardiovascular:     Rate and Rhythm: Tachycardia present. Rhythm irregular.     Pulses: Normal pulses.     Heart sounds: Normal heart sounds.  Pulmonary:     Effort: Pulmonary effort is normal.     Breath sounds: Normal breath sounds.  Abdominal:     General: Abdomen is flat. Bowel sounds are normal.     Palpations: Abdomen is soft.  Musculoskeletal:        General: Normal range of motion.     Cervical back: Normal range of motion and neck supple.  Skin:    General: Skin is warm.     Capillary Refill: Capillary refill takes less than 2 seconds.  Neurological:     General: No focal deficit present.     Mental Status: He is alert and oriented to person, place, and time.  Psychiatric:        Mood and Affect: Mood normal.        Behavior: Behavior normal.    ED Results / Procedures / Treatments   Labs (all labs ordered are listed, but only abnormal results are displayed) Labs Reviewed  BASIC METABOLIC PANEL - Abnormal; Notable for the following components:      Result Value   Glucose, Bld 105 (*)    Calcium 8.7 (*)    All other components within normal limits  MAGNESIUM  CBC  TSH  PROTIME-INR    EKG EKG Interpretation  Date/Time:  Friday June 18 2021 14:54:46 EST Ventricular Rate:  122 PR Interval:    QRS Duration: 115 QT Interval:  332 QTC Calculation: 498 R Axis:   25 Text Interpretation: Atrial flutter with 2:1 AV block Nonspecific  intraventricular conduction delay Inferior infarct, age indeterminate No significant change since last tracing Confirmed by Isla Pence [id number]) on 06/18/2021 3:12:22 PM  Radiology DG Chest Port 1 View  Result Date: 06/18/2021 CLINICAL  DATA:  Atrial fibrillation. EXAM: PORTABLE CHEST 1 VIEW COMPARISON:  None. FINDINGS: Lordotic positioning noted. The heart size and mediastinal contours are within normal limits. Both lungs are clear. The visualized skeletal structures are unremarkable. IMPRESSION: No active disease. Electronically Signed   By: Marlaine Hind M.D.   On: 06/18/2021 15:55    Procedures .Cardioversion  Date/Time: 06/18/2021 5:18 PM Performed by: Isla Pence, MD Authorized by: Isla Pence, MD   Consent:    Consent obtained:  Verbal   Consent given by:  Patient   Alternatives discussed:  Rate-control medication Pre-procedure details:    Cardioversion basis:  Emergent   Rhythm:  Atrial fibrillation   Electrode placement:  Anterior-posterior Patient sedated: Yes. Refer to sedation procedure documentation for details of sedation.  Attempt one:    Cardioversion mode:  Synchronous   Shock (Joules):  120   Shock outcome:  Conversion to normal sinus rhythm Post-procedure details:    Patient status:  Awake   Patient tolerance of procedure:  Tolerated well, no immediate complications .Sedation  Date/Time: 06/18/2021 5:20 PM Performed by: Isla Pence, MD Authorized by: Isla Pence, MD   Consent:    Consent obtained:  Written   Consent given by:  Patient Universal protocol:    Immediately prior to procedure, a time out was called: yes     Patient identity confirmed:  Verbally with patient Indications:    Procedure performed:  Cardioversion   Procedure necessitating sedation performed by:  Physician performing sedation Pre-sedation assessment:    Time since last food or drink:  5   ASA classification: class 2 - patient with mild systemic disease      Mallampati score:  I - soft palate, uvula, fauces, pillars visible   Pre-sedation assessments completed and reviewed: airway patency, cardiovascular function, hydration status, mental status, nausea/vomiting, pain level, respiratory function and temperature   Immediate pre-procedure details:    Reassessment: Patient reassessed immediately prior to procedure     Reviewed: vital signs     Verified: bag valve mask available, emergency equipment available, intubation equipment available and IV patency confirmed   Procedure details (see MAR for exact dosages):    Preoxygenation:  Room air   Sedation:  Etomidate and midazolam   Intended level of sedation: deep   Analgesia:  None   Intra-procedure monitoring:  Blood pressure monitoring, cardiac monitor, continuous capnometry, continuous pulse oximetry, frequent LOC assessments and frequent vital sign checks   Intra-procedure events: none     Total Provider sedation time (minutes):  30 Post-procedure details:    Post-sedation assessment completed:  06/18/2021 5:20 PM   Attendance: Constant attendance by certified staff until patient recovered     Recovery: Patient returned to pre-procedure baseline     Patient is stable for discharge or admission: yes     Procedure completion:  Tolerated well, no immediate complications   Medications Ordered in ED Medications  metoprolol tartrate (LOPRESSOR) injection 5 mg (has no administration in time range)  etomidate (AMIDATE) injection 10 mg (has no administration in time range)  etomidate (AMIDATE) injection (10 mg Intravenous Given 06/18/21 1712)  midazolam (VERSED) injection 1 mg (1 mg Intravenous Given 06/18/21 1709)  heparin bolus via infusion 4,000 Units (4,000 Units Intravenous Bolus from Bag 06/18/21 1705)    ED Course  I have reviewed the triage vital signs and the nursing notes.  Pertinent labs & imaging results that were available during my care of the patient were reviewed by me and considered  in  my medical decision making (see chart for details).    MDM Rules/Calculators/A&P                           Pt d/w cardiology, Dr. Gasper Sells who recommended cardioversion as long as pt can accurately say his afib/flutter started within 48 hrs.  He also recommends giving pt a bolus of heparin prior to cardioversion.  Pt is symptomatic from his afib and wears his watch all the time.  He is sure sx started between 0200 and 0230 today.  Pt is willing to do cardioversion.  Pt was successfully cardioverted.  He will be d/c with Eliquis.  Pt is referred to the afib clinic.  CRITICAL CARE Performed by: Isla Pence   Total critical care time: 30 minutes  Critical care time was exclusive of separately billable procedures and treating other patients.  Critical care was necessary to treat or prevent imminent or life-threatening deterioration.  Critical care was time spent personally by me on the following activities: development of treatment plan with patient and/or surrogate as well as nursing, discussions with consultants, evaluation of patient's response to treatment, examination of patient, obtaining history from patient or surrogate, ordering and performing treatments and interventions, ordering and review of laboratory studies, ordering and review of radiographic studies, pulse oximetry and re-evaluation of patient's condition.    Final Clinical Impression(s) / ED Diagnoses Final diagnoses:  Atrial flutter with rapid ventricular response ([place])    Rx / DC Orders ED Discharge Orders          Ordered    Amb referral to AFIB Clinic        06/18/21 1623    apixaban (ELIQUIS) 5 MG TABS tablet  2 times daily        06/18/21 1718             Isla Pence, MD 06/18/21 1721

## 2021-06-18 NOTE — ED Notes (Signed)
Unable to obtain temp in patient's room due to malfunctioning equipment

## 2021-06-18 NOTE — ED Triage Notes (Signed)
Pt to ED from urgent care via EMS. Fitbit told him he was in a-fib 11 times last night over half hour period. No blood thinners. A&Ox4. Reports getting fatigued more easily than usual. 18ga LAC, 10mg  diltiazem 6 minutes apart for 20mg  total. HR 130s-140s.  BP 130s-140s/80 HR 130s-140s irreg

## 2021-06-18 NOTE — ED Provider Notes (Addendum)
Patient presents today complaining of his Fitbit telling him that he had 11 episodes of an abnormal heart rhythm overnight last night.  Patient denies history of arrhythmia, currently not taking medications for anticoagulation or any prior rhythmic medications.  Patient states he had a similar episode in September, mentioned it to his primary care provider at his next follow-up appointment in September, states primary care did not refer him to cardiology.    Patient states today he is feeling a little bit short of breath and a little tired.  Heart rate is variable between 70 and 100 at triage.  Patient's blood pressure is normal, patient is mentating well, denies dizziness, nausea.  Per my observation, patient appears to be a little diaphoretic.  EKG performed on arrival revealed atrial flutter with variable AV node block with either PVCs or aberrant conduction, left axis deviation and nonspecific ST and T wave abnormalities.  EMS was contacted for transport to the emergency room for further cardiac work-up, possible cardioversion.  Patient was advised.   Lynden Oxford Scales, PA-C 06/18/21 1316    Lynden Oxford [place], [place] 06/18/21 1318

## 2021-06-18 NOTE — ED Notes (Addendum)
EMS notified of need for patient pick up, Ria Comment APP in room with patient at this time, no further orders at this time

## 2021-06-18 NOTE — ED Notes (Signed)
Patient is calm, alert and stable at this time.

## 2021-06-18 NOTE — ED Notes (Addendum)
Patient is being discharged from the Urgent Care and sent to the Emergency Department via EMS. Per L. Blanchie Serve, patient is in need of higher level of care due to further cardiac evaluation. Patient is aware and verbalizes understanding of plan of care.  Vitals:   06/18/21 1240 06/18/21 1244  BP: 121/70   Pulse: 73 100  Resp: 20   Temp: 98.8 F (37.1 C)   SpO2: 95%      Patient not in dis tress at this time.

## 2021-06-18 NOTE — Telephone Encounter (Signed)
Patient Name: Scott Montoya Gender: Male DOB: [birth year] Age: 65 Y 10 M 18 D Return Phone Number: [phone] (Primary) Address: City/ State/ Zip: [place]  [address] Client [facility] [place] Client Site [facility] [facility] Paediatric nurse, Freight forwarder- PA Contact Type Call Who Is Calling Patient / Member / Family / Caregiver Call Type Triage / Clinical Relationship To Patient Self Return Phone Number [phone] (Primary) Chief Complaint Fatigue (greater than THREE MONTHS old) Reason for Call Symptomatic / Request for [place] states, pt had low hr last night 40 to 140. Pt had this happen 11 times. Pt has prev. issues. Pt has cpap was party of it. Pt is fatigued. Translation No Nurse Assessment Nurse: Raphael Gibney, RN, Vanita Ingles Date/Time (Eastern Time): 06/18/2021 11:01:40 AM Confirm and document reason for call. If symptomatic, describe symptoms. ---Caller states he had heart rate of 40 to 150 11 times last night. Wears a fit bit. now it is 98 and 84 resting heart rate. Does the patient have any new or worsening symptoms? ---Yes Will a triage be completed? ---Yes Related visit to physician within the last 2 weeks? ---No Does the PT have any chronic conditions? (i.e. diabetes, asthma, this includes High risk factors for pregnancy, etc.) ---Yes List chronic conditions. ---HTN Is this a behavioral health or substance abuse call? ---No Guidelines Guideline Title Affirmed Question Affirmed Notes Nurse Date/Time (Eastern Time) Heart Rate and Heartbeat Questions [1] Heart beating very rapidly (e.g., > 140 / minute) AND [2] not present now (Exception: during exercise) Raphael Gibney, RN, Vera 06/18/2021 11:06 Disp. Time Eilene Ghazi Time) Disposition Final User 06/18/2021 11:10:31 AM See HCP within 4 Hours (or PCP triage) Yes Raphael Gibney, RN, Doreatha Lew Disagree/Comply Comply Caller  Understands Yes PreDisposition Call Doctor Care Advice Given Per Guideline SEE HCP (OR PCP TRIAGE) WITHIN 4 HOURS: * IF OFFICE WILL BE OPEN: You need to be seen within the next 3 or 4 hours. Call your doctor (or NP/PA) now or as soon as the office opens. * You become worse CALL BACK IF: CARE ADVICE given per Heart Rate and Heartbeat Questions (Adult) guideline. Comments User: Dannielle Burn, RN Date/Time Eilene Ghazi Time): 06/18/2021 11:13:38 AM called back line and gave report that pt has triage outcome see provider within 4 hrs. No appts available. Advised pt to go to urgent care. States he will. Referrals GO TO FACILITY UNDECIDED

## 2021-06-18 NOTE — ED Notes (Signed)
Cardioversion performed by Dr. Gilford Raid

## 2021-06-18 NOTE — ED Notes (Signed)
EMS arrival

## 2021-06-22 ENCOUNTER — Ambulatory Visit: Payer: Medicare HMO | Admitting: Cardiology

## 2021-06-22 ENCOUNTER — Other Ambulatory Visit: Payer: Self-pay

## 2021-06-22 ENCOUNTER — Encounter: Payer: Self-pay | Admitting: Cardiology

## 2021-06-22 ENCOUNTER — Other Ambulatory Visit: Payer: Self-pay | Admitting: *Deleted

## 2021-06-22 VITALS — BP 132/84 | HR 75 | Ht 73.0 in | Wt 220.4 lb

## 2021-06-22 DIAGNOSIS — I4892 Unspecified atrial flutter: Secondary | ICD-10-CM | POA: Diagnosis not present

## 2021-06-22 DIAGNOSIS — I10 Essential (primary) hypertension: Secondary | ICD-10-CM

## 2021-06-22 DIAGNOSIS — R911 Solitary pulmonary nodule: Secondary | ICD-10-CM

## 2021-06-22 MED ORDER — DILTIAZEM HCL ER COATED BEADS 240 MG PO CP24: 240.0000 mg | ORAL_CAPSULE | Freq: Every day | ORAL | 3 refills | Status: DC

## 2021-06-22 MED ORDER — APIXABAN 5 MG PO TABS: 5.0000 mg | ORAL_TABLET | Freq: Two times a day (BID) | ORAL | 3 refills | Status: DC

## 2021-06-22 NOTE — Patient Instructions (Signed)
Medication Instructions:   STOP ASPIRIN  STOP AMLODIPINE  START DILTIAZEM CD 240 MG ONCE DAILY  *If you need a refill on your cardiac medications before your next appointment, please call your pharmacy*   Testing/Procedures:  CT OF THE CHEST W/O CONTRAST TO FOLLOW UP ON LUNG NODULE AT [facility] [place]  Your physician has requested that you have an echocardiogram. Echocardiography is a painless test that uses sound waves to create images of your heart. It provides your doctor with information about the size and shape of your heart and how well your heart's chambers and valves are working. This procedure takes approximately one hour. There are no restrictions for this procedure. [place]   Follow-Up: At [facility], you and your health needs are our priority.  As part of our continuing mission to provide you with exceptional heart care, we have created designated Provider Care Teams.  These Care Teams include your primary Cardiologist (physician) and Advanced Practice Providers (APPs -  Physician Assistants and Nurse Practitioners) who all work together to provide you with the care you need, when you need it.  We recommend signing up for the patient portal called "MyChart".  Sign up information is provided on this After Visit Summary.  MyChart is used to connect with patients for Virtual Visits (Telemedicine).  Patients are able to view lab/test results, encounter notes, upcoming appointments, etc.  Non-urgent messages can be sent to your provider as well.   To learn more about what you can do with MyChart, go to [email or web address].    Your next appointment:   6 month(s)  The format for your next appointment:   In Person  Provider:   Kirk Ruths MD

## 2021-06-22 NOTE — Progress Notes (Signed)
Grayling Congress MD Reason for referral-atrial flutter  HPI: 65 year old male for evaluation of atrial flutter at request of Isla Pence MD.  Echocardiogram January 2016 showed normal LV function, mild right ventricular enlargement.  Abdominal CT December 2021 showed colonic diverticulosis, 4 mm nodule and aortic atherosclerosis; note no aneurysm noted.  Follow-up recommended 12 months.  Patient seen in the emergency room December 2 with atrial flutter.  TSH normal, potassium 4.0, creatinine 0.93, hemoglobin 14.2.  Patient underwent cardioversion and was discharged with apixaban.  Asked to follow-up with cardiology.  On the day he was diagnosed with atrial flutter he noticed that his heart rate was elevated on his smart watch.  He had some fatigue but denies palpitations, dyspnea or chest pain.  He has had no symptoms since his cardioversion.  He typically does not have significant dyspnea on exertion, orthopnea, PND, exertional chest pain or syncope.  Chronic mild pedal edema that he attributes to varicose veins.  Current Outpatient Medications  Medication Sig Dispense Refill   albuterol (VENTOLIN HFA) 108 (90 Base) MCG/ACT inhaler Inhale 1 puff into the lungs every 6 (six) hours as needed for wheezing or shortness of breath.     amLODipine (NORVASC) 10 MG tablet Take 1 tablet (10 mg total) by mouth daily. 90 tablet 2   apixaban (ELIQUIS) 5 MG TABS tablet Take 1 tablet (5 mg total) by mouth 2 (two) times daily. 60 tablet 0   aspirin EC 81 MG tablet Take 81 mg by mouth daily. Swallow whole.     Boswellia-Glucosamine-Vit D (OSTEO BI-FLEX ONE PER DAY PO) Take by mouth.     cholecalciferol (VITAMIN D3) 25 MCG (1000 UNIT) tablet Take 1,000 Units by mouth daily.     cyanocobalamin 100 MCG tablet Take 100 mcg by mouth daily.     losartan (COZAAR) 100 MG tablet Take 1 tablet (100 mg total) by mouth daily. 90 tablet 2   Multiple Vitamin (MULTIVITAMIN) capsule Take 1 capsule by mouth daily.  Occuvite     Omega-3 Fatty Acids (FISH OIL) 1000 MG CAPS Take by mouth.     PFIZER-BIONT COVID-19 VAC-TRIS SUSP injection      PREVNAR 20 0.5 ML injection      Turmeric (QC TUMERIC COMPLEX PO) Take by mouth.     Zinc 50 MG CAPS Take by mouth.     No current facility-administered medications for this visit.    Allergies  Allergen Reactions   Codeine     Other reaction(s): dry mouth   Sulfa Antibiotics Nausea And Vomiting    Other reaction(s): Unknown Reaction Other reaction(s): Unknown Reaction      Past Medical History:  Diagnosis Date   Atrial flutter ([place])    Basal cell carcinoma 09/09/2020   sup-left upper back (CX35FU)- PCP did bx   Cataract    COPD (chronic obstructive pulmonary disease) (HCC)    Hypertension    OSA (obstructive sleep apnea)     Past Surgical History:  Procedure Laterality Date   EYE SURGERY     foot heel repair  2000   HEEL SPUR SURGERY     IR [place] GUIDED LOC OF NEEDLE/CATH TIP FOR SPINAL INJECTION LT     ORBITAL RECONSTRUCTION  1994   ROTATOR CUFF REPAIR Right 2018    Social History   Socioeconomic History   Marital status: Divorced    Spouse name: Not on file   Number of children: 3   Years of education: Not on file  Highest education level: Not on file  Occupational History   Not on file  Tobacco Use   Smoking status: Former    Types: Cigarettes   Smokeless tobacco: Never  Vaping Use   Vaping Use: Some days   Devices: Juul  Substance and Sexual Activity   Alcohol use: Yes    Alcohol/week: 28.0 standard drinks    Types: 14 Glasses of wine, 14 Cans of beer per week    Comment: 4 drinks per day   Drug use: Not Currently    Types: Marijuana   Sexual activity: Yes    Birth control/protection: None  Other Topics Concern   Not on file  Social History Narrative   Not on file   Social Determinants of Health   Financial Resource Strain: Low Risk    Difficulty of Paying Living Expenses: Not hard at all  Food Insecurity: No  Food Insecurity   Worried About Charity fundraiser in the Last Year: Never true   Ran Out of Food in the Last Year: Never true  Transportation Needs: No Transportation Needs   Lack of Transportation (Medical): No   Lack of Transportation (Non-Medical): No  Physical Activity: Sufficiently Active   Days of Exercise per Week: 5 days   Minutes of Exercise per Session: 40 min  Stress: No Stress Concern Present   Feeling of Stress : Not at all  Social Connections: Socially Isolated   Frequency of Communication with Friends and Family: Twice a week   Frequency of Social Gatherings with Friends and Family: Three times a week   Attends Religious Services: Never   Active Member of Clubs or Organizations: No   Attends Archivist Meetings: Never   Marital Status: Divorced  Human resources officer Violence: Not At Risk   Fear of Current or Ex-Partner: No   Emotionally Abused: No   Physically Abused: No   Sexually Abused: No    Family History  Problem Relation Age of Onset   Heart attack Father     ROS: no fevers or chills, productive cough, hemoptysis, dysphasia, odynophagia, melena, hematochezia, dysuria, hematuria, rash, seizure activity, orthopnea, PND, pedal edema, claudication. Remaining systems are negative.  Physical Exam:   Blood pressure 132/84, pulse 75, height 6\' 1"  (1.854 m), weight 220 lb 6.4 oz (100 kg), SpO2 99 %.  General:  Well developed/well nourished in NAD Skin warm/dry Patient not depressed No peripheral clubbing Back-normal HEENT-normal/normal eyelids Neck supple/normal carotid upstroke bilaterally; no bruits; no JVD; no thyromegaly chest - CTA/ normal expansion CV - RRR/normal S1 and S2; no murmurs, rubs or gallops;  PMI nondisplaced Abdomen -NT/ND, no HSM, no mass, + bowel sounds, no bruit 2+ femoral pulses, no bruits Ext-no edema, chords, 2+ DP Neuro-grossly nonfocal  ECG -June 18, 2021-typical atrial flutter with rapid ventricular response  personally reviewed  Follow-up electrocardiogram June 18, 2021 showed sinus rhythm with no ST changes personally reviewed.  Today's electrocardiogram shows sinus rhythm at a rate of 75 with no ST changes.  A/P  1 atrial flutter-CHA2DS2-VASc 3 for age 70, aortic atherosclerosis and hypertension.  We will continue apixaban.  Discontinue aspirin.  Discontinue amlodipine and instead treat with Cardizem 240 mg daily.  Referred to electrophysiology for consideration of atrial flutter ablation to avoid long-term anticoagulation.  Repeat echocardiogram.  2 hypertension-as above we are discontinuing amlodipine and instead treating with Cardizem both for blood pressure and rate control if atrial flutter recurs.  Follow blood pressure and adjust medications as needed.  3 history of aortic atherosclerosis-we discussed addition of statin but he would prefer to avoid at this point.  4 history of lung nodule-we will arrange noncontrast chest CT to rule out progression of nodule.    Kirk Ruths, MD

## 2021-06-23 ENCOUNTER — Encounter: Payer: Self-pay | Admitting: Cardiology

## 2021-06-24 ENCOUNTER — Encounter: Payer: Self-pay | Admitting: Cardiology

## 2021-07-08 ENCOUNTER — Telehealth: Payer: Self-pay | Admitting: Physician Assistant

## 2021-07-08 NOTE — Telephone Encounter (Signed)
Noted  

## 2021-07-08 NOTE — Telephone Encounter (Signed)
pt would like to inform allwardt that he received his second shingle shot on [place] [facility]. please update records.  [place], [place] [phone]

## 2021-07-13 ENCOUNTER — Ambulatory Visit (HOSPITAL_COMMUNITY): Payer: Medicare HMO | Attending: Cardiovascular Disease

## 2021-07-13 ENCOUNTER — Other Ambulatory Visit: Payer: Self-pay

## 2021-07-13 DIAGNOSIS — G4733 Obstructive sleep apnea (adult) (pediatric): Secondary | ICD-10-CM | POA: Diagnosis not present

## 2021-07-13 DIAGNOSIS — I4892 Unspecified atrial flutter: Secondary | ICD-10-CM | POA: Insufficient documentation

## 2021-07-13 LAB — ECHOCARDIOGRAM COMPLETE
Area-P 1/2: 2.93 cm2
S' Lateral: 3 cm

## 2021-07-14 ENCOUNTER — Ambulatory Visit
Admission: RE | Admit: 2021-07-14 | Discharge: 2021-07-14 | Disposition: A | Discharge status: Home / Self Care | Payer: Medicare HMO | Source: Ambulatory Visit | Attending: Cardiology | Admitting: Cardiology

## 2021-07-14 DIAGNOSIS — R911 Solitary pulmonary nodule: Secondary | ICD-10-CM

## 2021-07-14 DIAGNOSIS — R918 Other nonspecific abnormal finding of lung field: Secondary | ICD-10-CM | POA: Diagnosis not present

## 2021-07-15 ENCOUNTER — Other Ambulatory Visit: Payer: Self-pay

## 2021-07-15 DIAGNOSIS — I2584 Coronary atherosclerosis due to calcified coronary lesion: Secondary | ICD-10-CM

## 2021-07-15 DIAGNOSIS — Z79899 Other long term (current) drug therapy: Secondary | ICD-10-CM

## 2021-07-15 DIAGNOSIS — R911 Solitary pulmonary nodule: Secondary | ICD-10-CM

## 2021-07-15 MED ORDER — ROSUVASTATIN CALCIUM 40 MG PO TABS: 40.0000 mg | ORAL_TABLET | Freq: Every day | ORAL | 3 refills | Status: DC

## 2021-07-22 ENCOUNTER — Encounter: Payer: Self-pay | Admitting: Cardiology

## 2021-07-27 ENCOUNTER — Encounter: Payer: Self-pay | Admitting: Internal Medicine

## 2021-07-27 ENCOUNTER — Ambulatory Visit: Payer: Medicare HMO | Admitting: Internal Medicine

## 2021-07-27 ENCOUNTER — Other Ambulatory Visit: Payer: Self-pay

## 2021-07-27 DIAGNOSIS — I4892 Unspecified atrial flutter: Secondary | ICD-10-CM | POA: Insufficient documentation

## 2021-07-27 NOTE — Patient Instructions (Addendum)
Medication Instructions:  Your physician recommends that you continue on your current medications as directed. Please refer to the Current Medication list given to you today.  Labwork: None ordered.  Testing/Procedures: None ordered.  Follow-Up:  SEE INSTRUCTION LETTER  Any Other Special Instructions Will Be Listed Below (If Applicable).  If you need a refill on your cardiac medications before your next appointment, please call your pharmacy.   Cardiac Ablation Cardiac ablation is a procedure to destroy, or ablate, a small amount of heart tissue in very specific places. The heart has many electrical connections. Sometimes these connections are abnormal and can cause the heart to beat very fast or irregularly. Ablating some of the areas that cause problems can improve the heart's rhythm or return it to normal. Ablation may be done for people who: Have Wolff-Parkinson-White syndrome. Have fast heart rhythms (tachycardia). Have taken medicines for an abnormal heart rhythm (arrhythmia) that were not effective or caused side effects. Have a high-risk heartbeat that may be life-threatening. During the procedure, a small incision is made in the neck or the groin, and a long, thin tube (catheter) is inserted into the incision and moved to the heart. Small devices (electrodes) on the tip of the catheter will send out electrical currents. A type of X-ray (fluoroscopy) will be used to help guide the catheter and to provide images of the heart. Tell a health care provider about: Any allergies you have. All medicines you are taking, including vitamins, herbs, eye drops, creams, and over-the-counter medicines. Any problems you or family members have had with anesthetic medicines. Any blood disorders you have. Any surgeries you have had. Any medical conditions you have, such as kidney failure. Whether you are pregnant or may be pregnant. What are the risks? Generally, this is a safe procedure.  However, problems may occur, including: Infection. Bruising and bleeding at the catheter insertion site. Bleeding into the chest, especially into the sac that surrounds the heart. This is a serious complication. Stroke or blood clots. Damage to nearby structures or organs. Allergic reaction to medicines or dyes. Need for a permanent pacemaker if the normal electrical system is damaged. A pacemaker is a small computer that sends electrical signals to the heart and helps your heart beat normally. The procedure not being fully effective. This may not be recognized until months later. Repeat ablation procedures are sometimes done. What happens before the procedure? Medicines Ask your health care provider about: Changing or stopping your regular medicines. This is especially important if you are taking diabetes medicines or blood thinners. Taking medicines such as aspirin and ibuprofen. These medicines can thin your blood. Do not take these medicines unless your health care provider tells you to take them. Taking over-the-counter medicines, vitamins, herbs, and supplements. General instructions Follow instructions from your health care provider about eating or drinking restrictions. Plan to have someone take you home from the hospital or clinic. If you will be going home right after the procedure, plan to have someone with you for 24 hours. Ask your health care provider what steps will be taken to prevent infection. What happens during the procedure?  An IV will be inserted into one of your veins. You will be given a medicine to help you relax (sedative). The skin on your neck or groin will be numbed. An incision will be made in your neck or your groin. A needle will be inserted through the incision and into a large vein in your neck or groin. A catheter will  be inserted into the needle and moved to your heart. Dye may be injected through the catheter to help your surgeon see the area of the  heart that needs treatment. Electrical currents will be sent from the catheter to ablate heart tissue in desired areas. There are three types of energy that may be used to do this: Heat (radiofrequency energy). Laser energy. Extreme cold (cryoablation). When the tissue has been ablated, the catheter will be removed. Pressure will be held on the insertion area to prevent a lot of bleeding. A bandage (dressing) will be placed over the insertion area. The exact procedure may vary among health care providers and hospitals. What happens after the procedure? Your blood pressure, heart rate, breathing rate, and blood oxygen level will be monitored until you leave the hospital or clinic. Your insertion area will be monitored for bleeding. You will need to lie still for a few hours to ensure that you do not bleed from the insertion area. Do not drive for 24 hours or as long as told by your health care provider. Summary Cardiac ablation is a procedure to destroy, or ablate, a small amount of heart tissue using an electrical current. This procedure can improve the heart rhythm or return it to normal. Tell your health care provider about any medical conditions you may have and all medicines you are taking to treat them. This is a safe procedure, but problems may occur. Problems may include infection, bruising, damage to nearby organs or structures, or allergic reactions to medicines. Follow your health care provider's instructions about eating and drinking before the procedure. You may also be told to change or stop some of your medicines. After the procedure, do not drive for 24 hours or as long as told by your health care provider. This information is not intended to replace advice given to you by your health care provider. Make sure you discuss any questions you have with your health care provider. Document Revised: 05/13/2019 Document Reviewed: 05/13/2019 Elsevier Patient Education  [place].

## 2021-07-27 NOTE — Progress Notes (Signed)
HPI Mr. Scott Montoya is referred by Dr. Stanford Breed for evaluation of atrial flutter. He is a pleasant 66 yo man with a h/o normal LV function, who has remote diverticulosis, who was found to have atrial flutter with a VR of 140/min. He underwent DCCV in early December. He does not have palpitations though he may have a little weakness. He was started on systemic anti-coagulation. He denies chest pain. He has been on medical therapy with cardizem and he remains on cozaar.  Allergies  Allergen Reactions   Codeine     Other reaction(s): dry mouth   Sulfa Antibiotics Nausea And Vomiting    Other reaction(s): Unknown Reaction Other reaction(s): Unknown Reaction      Current Outpatient Medications  Medication Sig Dispense Refill   albuterol (VENTOLIN HFA) 108 (90 Base) MCG/ACT inhaler Inhale 1 puff into the lungs every 6 (six) hours as needed for wheezing or shortness of breath.     Boswellia-Glucosamine-Vit D (OSTEO BI-FLEX ONE PER DAY PO) Take by mouth.     cholecalciferol (VITAMIN D3) 25 MCG (1000 UNIT) tablet Take 1,000 Units by mouth daily.     cyanocobalamin 100 MCG tablet Take 100 mcg by mouth daily.     diltiazem (CARDIZEM CD) 240 MG 24 hr capsule Take 1 capsule (240 mg total) by mouth daily. 90 capsule 3   losartan (COZAAR) 100 MG tablet Take 1 tablet (100 mg total) by mouth daily. 90 tablet 2   Multiple Vitamin (MULTIVITAMIN) capsule Take 1 capsule by mouth daily. Occuvite     Omega-3 Fatty Acids (FISH OIL) 1000 MG CAPS Take by mouth.     PFIZER-BIONT COVID-19 VAC-TRIS SUSP injection      PREVNAR 20 0.5 ML injection      rosuvastatin (CRESTOR) 40 MG tablet Take 1 tablet (40 mg total) by mouth daily. 90 tablet 3   Turmeric (QC TUMERIC COMPLEX PO) Take by mouth.     Zinc 50 MG CAPS Take by mouth.     apixaban (ELIQUIS) 5 MG TABS tablet Take 1 tablet (5 mg total) by mouth 2 (two) times daily. 180 tablet 3   No current facility-administered medications for this visit.     Past  Medical History:  Diagnosis Date   Atrial flutter ([place])    Basal cell carcinoma 09/09/2020   sup-left upper back (CX35FU)- PCP did bx   Cataract    COPD (chronic obstructive pulmonary disease) (HCC)    Hypertension    OSA (obstructive sleep apnea)     ROS:   All systems reviewed and negative except as noted in the HPI.   Past Surgical History:  Procedure Laterality Date   EYE SURGERY     foot heel repair  2000   HEEL SPUR SURGERY     IR [place] GUIDED LOC OF NEEDLE/CATH TIP FOR SPINAL INJECTION LT     ORBITAL RECONSTRUCTION  1994   ROTATOR CUFF REPAIR Right 2018     Family History  Problem Relation Age of Onset   Heart attack Father      Social History   Socioeconomic History   Marital status: Divorced    Spouse name: Not on file   Number of children: 3   Years of education: Not on file   Highest education level: Not on file  Occupational History   Not on file  Tobacco Use   Smoking status: Former    Types: Cigarettes   Smokeless tobacco: Never  Vaping Use  Vaping Use: Some days   Devices: Juul  Substance and Sexual Activity   Alcohol use: Yes    Alcohol/week: 28.0 standard drinks    Types: 14 Glasses of wine, 14 Cans of beer per week    Comment: 4 drinks per day   Drug use: Not Currently    Types: Marijuana   Sexual activity: Yes    Birth control/protection: None  Other Topics Concern   Not on file  Social History Narrative   Not on file   Social Determinants of Health   Financial Resource Strain: Low Risk    Difficulty of Paying Living Expenses: Not hard at all  Food Insecurity: No Food Insecurity   Worried About Charity fundraiser in the Last Year: Never true   Ran Out of Food in the Last Year: Never true  Transportation Needs: No Transportation Needs   Lack of Transportation (Medical): No   Lack of Transportation (Non-Medical): No  Physical Activity: Sufficiently Active   Days of Exercise per Week: 5 days   Minutes of Exercise per Session:  40 min  Stress: No Stress Concern Present   Feeling of Stress : Not at all  Social Connections: Socially Isolated   Frequency of Communication with Friends and Family: Twice a week   Frequency of Social Gatherings with Friends and Family: Three times a week   Attends Religious Services: Never   Active Member of Clubs or Organizations: No   Attends Archivist Meetings: Never   Marital Status: Divorced  Human resources officer Violence: Not At Risk   Fear of Current or Ex-Partner: No   Emotionally Abused: No   Physically Abused: No   Sexually Abused: No     BP 134/76    Pulse (!) 58    Ht 6\' 1"  (1.854 m)    Wt 220 lb (99.8 kg)    SpO2 91%    BMI 29.03 kg/m   Physical Exam:  Well appearing NAD HEENT: Unremarkable Neck:  No JVD, no thyromegally Lymphatics:  No adenopathy Back:  No CVA tenderness Lungs:  Clear with no wheezes HEART:  Regular rate rhythm, no murmurs, no rubs, no clicks Abd:  soft, positive bowel sounds, no organomegally, no rebound, no guarding Ext:  2 plus pulses, no edema, no cyanosis, no clubbing Skin:  No rashes no nodules Neuro:  CN II through XII intact, motor grossly intact  EKG - sinus brady  Assess/Plan:  Atrial flutter - he is currently back in NSR. I have discussed the indications including the risks/benefits/goals,expectations of catheter ablation and he will call if he wishes to proceed. HTN - her bp is controlled. No change in meds. Coags - he will continue his [place] after ablation.   Carleene Overlie Eduard Penkala,MD

## 2021-07-28 ENCOUNTER — Telehealth: Payer: Self-pay | Admitting: Physician Assistant

## 2021-07-28 NOTE — Telephone Encounter (Signed)
Pt called to request his chart be updated regarding his Shingles vaccine. He had his second dose on 06/24/21

## 2021-07-30 ENCOUNTER — Encounter: Payer: Self-pay | Admitting: Internal Medicine

## 2021-07-30 NOTE — Telephone Encounter (Signed)
Updated in patient chart

## 2021-08-02 ENCOUNTER — Encounter: Payer: Self-pay | Admitting: Physician Assistant

## 2021-08-02 ENCOUNTER — Telehealth (INDEPENDENT_AMBULATORY_CARE_PROVIDER_SITE_OTHER): Payer: Medicare HMO | Admitting: Physician Assistant

## 2021-08-02 ENCOUNTER — Encounter: Payer: Self-pay | Admitting: Cardiology

## 2021-08-02 VITALS — Ht 73.0 in

## 2021-08-02 DIAGNOSIS — I10 Essential (primary) hypertension: Secondary | ICD-10-CM | POA: Diagnosis not present

## 2021-08-02 DIAGNOSIS — U071 COVID-19: Secondary | ICD-10-CM | POA: Diagnosis not present

## 2021-08-02 MED ORDER — MOLNUPIRAVIR EUA 200MG CAPSULE: 4.0000 | ORAL_CAPSULE | Freq: Two times a day (BID) | ORAL | 0 refills | Status: AC

## 2021-08-02 NOTE — Progress Notes (Signed)
Virtual Visit via Video Note  I connected with  [name]  on 08/02/21 at  2:00 PM EST by a video enabled telemedicine application and verified that I am speaking with the correct person using two identifiers.  Location: Patient: home Provider: Therapist, music at [place] present: Patient and myself   I discussed the limitations of evaluation and management by telemedicine and the availability of in person appointments. The patient expressed understanding and agreed to proceed.   History of Present Illness:  Chief complaint: COVID-19 positive via home test today Symptom onset: 08/01/2021 - started feeling like he wasn't feeling well Pertinent positives: Runny nose, aching, lethargic, a few episodes of nausea Pertinent negatives: CP, SOB, headache, vision changes, V/D, abd pain, loss of appetite. Treatments tried:  Vaccine status: 4 COVID-19 vaccines total  Sick exposure: Unsure  He was able to complete his water aerobics class today, but felt worse at work. He was only able to get some juice down today, didn't have much appetite.     Observations/Objective:  Temp 99.4 F BP 190/90-99; Recheck at end of visit is 174/94 HR 72 bpm   Gen: Awake, alert, no acute distress Resp: Breathing is even and non-labored Psych: calm/pleasant demeanor Neuro: Alert and Oriented x 3, + facial symmetry, speech is clear.   Assessment and Plan:  1. COVID-19 Diagnosis confirmed via home antigen test.  Patient is currently having mild symptoms.  We discussed current algorithm recommendations for prescribing outpatient antivirals.  Patient is within 5 day window of onset of symptoms and over age 66, antiviral therapy could be beneficial for him at this time.  Risks versus benefits discussed.  Agreed to Rx Molnupiravir with possible SE discussed.  Advised self-isolation at home for the next 5 days and then masking around others for at least an additional 5 days.  Treat  supportively as well at this time including sleeping prone, deep breathing exercises, pushing fluids, walking every few hours, vitamins C and D, and Tylenol or ibuprofen as needed.  The patient understands that COVID-19 illness can wax and wane.  Should the symptoms acutely worsen or patient starts to experience sudden shortness of breath, chest pain, severe weakness, the patient will go straight to the emergency department.  Also advised home pulse oximetry monitoring and for any reading consistently under 92%, should also report to the emergency department.  The patient will continue to keep [place] updated.  His BP was also quite elevated, but no malignant symptoms present. Informed to rest, push water today, recheck this evening and tomorrow morning. Call / Mychart with updates. Low threshold for ED.   Follow Up Instructions:    I discussed the assessment and treatment plan with the patient. The patient was provided an opportunity to ask questions and all were answered. The patient agreed with the plan and demonstrated an understanding of the instructions.   The patient was advised to call back or seek an in-person evaluation if the symptoms worsen or if the condition fails to improve as anticipated.  Draco Malczewski M Demetrice Combes, PA-C

## 2021-08-02 NOTE — Patient Instructions (Signed)
Take the antiviral as directed. Push fluids. Monitor BP.  Low threshold for ER if any acute symptoms or BP readings severely elevated with symptoms.

## 2021-08-10 MED ORDER — CHLORTHALIDONE 25 MG PO TABS: 12.5000 mg | ORAL_TABLET | Freq: Every day | ORAL | 3 refills | Status: DC

## 2021-08-12 ENCOUNTER — Encounter: Payer: Self-pay | Admitting: Cardiology

## 2021-08-12 NOTE — Telephone Encounter (Signed)
Called pt he states that he woke at about 6am and this episode (per his fitbit watch) happened at 4am and lasted until about 420am HR ranging 39 to 173 about 10 times per watch report. Pt thought that since he is having an upcoming ablation that he should notify us. This has happened twice before 03-28-21 and "sometime in November. He states that when this happened those time it was determined to be A-Flutter not AFIB. He is thinking that this was Flutter again. His ablation is scheduled for 09-04-21 w/Dr Lovena Le. Pt states that this did not wake him, and he has not had any symptoms today. What should we do now? Will forward message to Dr Stanford Breed and Dr Lovena Le.

## 2021-08-13 DIAGNOSIS — G4733 Obstructive sleep apnea (adult) (pediatric): Secondary | ICD-10-CM | POA: Diagnosis not present

## 2021-08-18 ENCOUNTER — Encounter: Payer: Self-pay | Admitting: Cardiology

## 2021-08-18 NOTE — Telephone Encounter (Signed)
Via Mychart, patient's fitbit showed heart rate (over 11 times) from 36-141 beats per minute from 3:20 am until 3:35 am. This occurred 5 out 7 nights. He had no other symptoms; he slept through it. This does not occur during the day. Last BP 125/85, stay hydrated, and takes medications 6 am and 6 pm. Please advise.

## 2021-08-20 ENCOUNTER — Ambulatory Visit: Payer: Medicare HMO | Admitting: Cardiovascular Disease

## 2021-08-27 ENCOUNTER — Other Ambulatory Visit: Payer: Medicare HMO | Admitting: *Deleted

## 2021-08-27 ENCOUNTER — Other Ambulatory Visit: Payer: Self-pay

## 2021-08-27 DIAGNOSIS — I4892 Unspecified atrial flutter: Secondary | ICD-10-CM | POA: Diagnosis not present

## 2021-08-28 LAB — CBC WITH DIFFERENTIAL/PLATELET
Basophils Absolute: 0.1 10*3/uL (ref 0.0–0.2)
Basos: 1 %
EOS (ABSOLUTE): 0.2 10*3/uL (ref 0.0–0.4)
Eos: 2 %
Hematocrit: 43 % (ref 37.5–51.0)
Hemoglobin: 15.4 g/dL (ref 13.0–17.7)
Immature Grans (Abs): 0.1 10*3/uL (ref 0.0–0.1)
Immature Granulocytes: 1 %
Lymphocytes Absolute: 1.6 10*3/uL (ref 0.7–3.1)
Lymphs: 26 %
MCH: 33 pg (ref 26.6–33.0)
MCHC: 35.8 g/dL — ABNORMAL HIGH (ref 31.5–35.7)
MCV: 92 fL (ref 79–97)
Monocytes Absolute: 0.5 10*3/uL (ref 0.1–0.9)
Monocytes: 9 %
Neutrophils Absolute: 3.8 10*3/uL (ref 1.4–7.0)
Neutrophils: 61 %
Platelets: 222 10*3/uL (ref 150–450)
RBC: 4.66 x10E6/uL (ref 4.14–5.80)
RDW: 12.2 % (ref 11.6–15.4)
WBC: 6.2 10*3/uL (ref 3.4–10.8)

## 2021-08-28 LAB — BASIC METABOLIC PANEL
BUN/Creatinine Ratio: 14 (ref 10–24)
BUN: 13 mg/dL (ref 8–27)
CO2: 27 mmol/L (ref 20–29)
Calcium: 9.7 mg/dL (ref 8.6–10.2)
Chloride: 101 mmol/L (ref 96–106)
Creatinine, Ser: 0.96 mg/dL (ref 0.76–1.27)
Glucose: 95 mg/dL (ref 70–99)
Potassium: 4.1 mmol/L (ref 3.5–5.2)
Sodium: 144 mmol/L (ref 134–144)
eGFR: 87 mL/min/{1.73_m2} (ref >59)

## 2021-09-01 ENCOUNTER — Other Ambulatory Visit: Payer: Self-pay

## 2021-09-01 ENCOUNTER — Telehealth: Payer: Self-pay | Admitting: Physician Assistant

## 2021-09-01 MED ORDER — ALBUTEROL SULFATE HFA 108 (90 BASE) MCG/ACT IN AERS: 1.0000 | INHALATION_SPRAY | Freq: Four times a day (QID) | RESPIRATORY_TRACT | 2 refills | Status: DC | PRN

## 2021-09-01 NOTE — Telephone Encounter (Signed)
Patient needs a refill for albuterol- please call in to [facility] [place].

## 2021-09-01 NOTE — Telephone Encounter (Signed)
Rx sent in

## 2021-09-02 NOTE — Pre-Procedure Instructions (Signed)
Attemtped to call patient regarding procedure instructions for tomorrow.  No answer.  Mail box full unable to leave voice mail.

## 2021-09-03 ENCOUNTER — Ambulatory Visit (HOSPITAL_COMMUNITY)
Admission: RE | Admit: 2021-09-03 | Discharge: 2021-09-04 | Disposition: A | Discharge status: Home / Self Care | Payer: Medicare HMO | Attending: Internal Medicine | Admitting: Internal Medicine

## 2021-09-03 ENCOUNTER — Other Ambulatory Visit: Payer: Self-pay

## 2021-09-03 ENCOUNTER — Ambulatory Visit (HOSPITAL_COMMUNITY)
Admission: RE | Disposition: A | Discharge status: Home / Self Care | Payer: Medicare HMO | Source: Home / Self Care | Attending: Internal Medicine

## 2021-09-03 DIAGNOSIS — I483 Typical atrial flutter: Secondary | ICD-10-CM

## 2021-09-03 DIAGNOSIS — Z7901 Long term (current) use of anticoagulants: Secondary | ICD-10-CM | POA: Insufficient documentation

## 2021-09-03 DIAGNOSIS — I4892 Unspecified atrial flutter: Secondary | ICD-10-CM | POA: Diagnosis present

## 2021-09-03 DIAGNOSIS — Z20822 Contact with and (suspected) exposure to covid-19: Secondary | ICD-10-CM | POA: Insufficient documentation

## 2021-09-03 DIAGNOSIS — I10 Essential (primary) hypertension: Secondary | ICD-10-CM | POA: Insufficient documentation

## 2021-09-03 DIAGNOSIS — I700 Atherosclerosis of aorta: Secondary | ICD-10-CM | POA: Insufficient documentation

## 2021-09-03 DIAGNOSIS — J449 Chronic obstructive pulmonary disease, unspecified: Secondary | ICD-10-CM | POA: Diagnosis not present

## 2021-09-03 DIAGNOSIS — Z87891 Personal history of nicotine dependence: Secondary | ICD-10-CM | POA: Diagnosis not present

## 2021-09-03 DIAGNOSIS — G4733 Obstructive sleep apnea (adult) (pediatric): Secondary | ICD-10-CM | POA: Insufficient documentation

## 2021-09-03 DIAGNOSIS — R911 Solitary pulmonary nodule: Secondary | ICD-10-CM | POA: Diagnosis not present

## 2021-09-03 HISTORY — PX: A-FLUTTER ABLATION: EP1230

## 2021-09-03 LAB — SARS CORONAVIRUS 2 BY RT PCR (HOSPITAL ORDER, PERFORMED IN ~~LOC~~ HOSPITAL LAB): SARS Coronavirus 2: NEGATIVE

## 2021-09-03 SURGERY — A-FLUTTER ABLATION

## 2021-09-03 MED ORDER — SODIUM CHLORIDE 0.9 % IV SOLN: INTRAVENOUS | Status: DC

## 2021-09-03 MED ORDER — LOSARTAN POTASSIUM 50 MG PO TABS
100.0000 mg | ORAL_TABLET | Freq: Every day | ORAL | Status: DC
  Administered 2021-09-03 – 2021-09-04 (×2): 100 mg via ORAL
  Filled 2021-09-03 (×2): qty 2

## 2021-09-03 MED ORDER — HEPARIN (PORCINE) IN NACL 1000-0.9 UT/500ML-% IV SOLN
INTRAVENOUS | Status: AC
  Filled 2021-09-03: qty 500

## 2021-09-03 MED ORDER — BUPIVACAINE HCL (PF) 0.25 % IJ SOLN
INTRAMUSCULAR | Status: AC
  Filled 2021-09-03: qty 30

## 2021-09-03 MED ORDER — SODIUM CHLORIDE 0.9% FLUSH: 3.0000 mL | Freq: Two times a day (BID) | INTRAVENOUS | Status: DC

## 2021-09-03 MED ORDER — SODIUM CHLORIDE 0.9% FLUSH: 3.0000 mL | INTRAVENOUS | Status: DC | PRN

## 2021-09-03 MED ORDER — MIDAZOLAM HCL 5 MG/5ML IJ SOLN
INTRAMUSCULAR | Status: AC
  Filled 2021-09-03: qty 5

## 2021-09-03 MED ORDER — FENTANYL CITRATE (PF) 100 MCG/2ML IJ SOLN
INTRAMUSCULAR | Status: AC
  Filled 2021-09-03: qty 2

## 2021-09-03 MED ORDER — HEPARIN (PORCINE) IN NACL 1000-0.9 UT/500ML-% IV SOLN
INTRAVENOUS | Status: DC | PRN
  Administered 2021-09-03: 500 mL

## 2021-09-03 MED ORDER — FENTANYL CITRATE (PF) 100 MCG/2ML IJ SOLN
INTRAMUSCULAR | Status: DC | PRN
  Administered 2021-09-03 (×5): 25 ug via INTRAVENOUS

## 2021-09-03 MED ORDER — ACETAMINOPHEN 325 MG PO TABS
650.0000 mg | ORAL_TABLET | ORAL | Status: DC | PRN
  Filled 2021-09-03: qty 2

## 2021-09-03 MED ORDER — HEPARIN SODIUM (PORCINE) 1000 UNIT/ML IJ SOLN
INTRAMUSCULAR | Status: DC | PRN
  Administered 2021-09-03: 1000 [IU] via INTRAVENOUS

## 2021-09-03 MED ORDER — MIDAZOLAM HCL 5 MG/5ML IJ SOLN
INTRAMUSCULAR | Status: DC | PRN
  Administered 2021-09-03 (×3): 2 mg via INTRAVENOUS
  Administered 2021-09-03: 1 mg via INTRAVENOUS
  Administered 2021-09-03: 2 mg via INTRAVENOUS

## 2021-09-03 MED ORDER — LIDOCAINE HCL (PF) 1 % IJ SOLN
INTRAMUSCULAR | Status: DC | PRN
  Administered 2021-09-03: 20 mL

## 2021-09-03 MED ORDER — ONDANSETRON HCL 4 MG/2ML IJ SOLN: 4.0000 mg | Freq: Four times a day (QID) | INTRAMUSCULAR | Status: DC | PRN

## 2021-09-03 MED ORDER — HEPARIN (PORCINE) IN NACL 2000-0.9 UNIT/L-% IV SOLN
INTRAVENOUS | Status: AC
  Filled 2021-09-03: qty 1000

## 2021-09-03 MED ORDER — SODIUM CHLORIDE 0.9 % IV SOLN: 250.0000 mL | INTRAVENOUS | Status: DC | PRN

## 2021-09-03 SURGICAL SUPPLY — 10 items
BAG SNAP BAND KOVER 36X36 (MISCELLANEOUS) ×1 IMPLANT
CATH SMTCH THERMOCOOL SF FJ (CATHETERS) ×1 IMPLANT
CATH WEB BI DIR CSDF CRV REPRO (CATHETERS) ×1 IMPLANT
PACK EP LATEX FREE (CUSTOM PROCEDURE TRAY) ×1
PACK EP LF (CUSTOM PROCEDURE TRAY) ×1 IMPLANT
PAD DEFIB RADIO PHYSIO CONN (PAD) ×2 IMPLANT
PATCH CARTO3 (PAD) ×1 IMPLANT
SHEATH PINNACLE 7F 10CM (SHEATH) ×1 IMPLANT
SHEATH PINNACLE 8F 10CM (SHEATH) ×1 IMPLANT
TUBING SMART ABLATE COOLFLOW (TUBING) ×1 IMPLANT

## 2021-09-03 NOTE — H&P (Signed)
HPI Scott Montoya is referred by Dr. Stanford Breed for evaluation of atrial flutter. He is a pleasant 66 yo man with a h/o normal LV function, who has remote diverticulosis, who was found to have atrial flutter with a VR of 140/min. He underwent DCCV in early December. He does not have palpitations though he may have a little weakness. He was started on systemic anti-coagulation. He denies chest pain. He has been on medical therapy with cardizem and he remains on cozaar.       Allergies  Allergen Reactions   Codeine        Other reaction(s): dry mouth   Sulfa Antibiotics Nausea And Vomiting      Other reaction(s): Unknown Reaction Other reaction(s): Unknown Reaction                Current Outpatient Medications  Medication Sig Dispense Refill   albuterol (VENTOLIN HFA) 108 (90 Base) MCG/ACT inhaler Inhale 1 puff into the lungs every 6 (six) hours as needed for wheezing or shortness of breath.       Boswellia-Glucosamine-Vit D (OSTEO BI-FLEX ONE PER DAY PO) Take by mouth.       cholecalciferol (VITAMIN D3) 25 MCG (1000 UNIT) tablet Take 1,000 Units by mouth daily.       cyanocobalamin 100 MCG tablet Take 100 mcg by mouth daily.       diltiazem (CARDIZEM CD) 240 MG 24 hr capsule Take 1 capsule (240 mg total) by mouth daily. 90 capsule 3   losartan (COZAAR) 100 MG tablet Take 1 tablet (100 mg total) by mouth daily. 90 tablet 2   Multiple Vitamin (MULTIVITAMIN) capsule Take 1 capsule by mouth daily. Occuvite       Omega-3 Fatty Acids (FISH OIL) 1000 MG CAPS Take by mouth.       PFIZER-BIONT COVID-19 VAC-TRIS SUSP injection         PREVNAR 20 0.5 ML injection         rosuvastatin (CRESTOR) 40 MG tablet Take 1 tablet (40 mg total) by mouth daily. 90 tablet 3   Turmeric (QC TUMERIC COMPLEX PO) Take by mouth.       Zinc 50 MG CAPS Take by mouth.       apixaban (ELIQUIS) 5 MG TABS tablet Take 1 tablet (5 mg total) by mouth 2 (two) times daily. 180 tablet 3    No current  facility-administered medications for this visit.            Past Medical History:  Diagnosis Date   Atrial flutter ([place])     Basal cell carcinoma 09/09/2020    sup-left upper back (CX35FU)- PCP did bx   Cataract     COPD (chronic obstructive pulmonary disease) ([facility])     Hypertension     OSA (obstructive sleep apnea)        ROS:    All systems reviewed and negative except as noted in the HPI.          Past Surgical History:  Procedure Laterality Date   EYE SURGERY       foot heel repair   2000   HEEL SPUR SURGERY       IR FL GUIDED LOC OF NEEDLE/CATH TIP FOR SPINAL INJECTION LT       ORBITAL RECONSTRUCTION   1994   ROTATOR CUFF REPAIR Right 2018             Family History  Problem Relation Age  of Onset   Heart attack Father          Social History         Socioeconomic History   Marital status: Divorced      Spouse name: Not on file   Number of children: 3   Years of education: Not on file   Highest education level: Not on file  Occupational History   Not on file  Tobacco Use   Smoking status: Former      Types: Cigarettes   Smokeless tobacco: Never  Vaping Use   Vaping Use: Some days   Devices: Juul  Substance and Sexual Activity   Alcohol use: Yes      Alcohol/week: 28.0 standard drinks      Types: 14 Glasses of wine, 14 Cans of beer per week      Comment: 4 drinks per day   Drug use: Not Currently      Types: Marijuana   Sexual activity: Yes      Birth control/protection: None  Other Topics Concern   Not on file  Social History Narrative   Not on file    Social Determinants of Health       Financial Resource Strain: Low Risk    Difficulty of Paying Living Expenses: Not hard at all  Food Insecurity: No Food Insecurity   Worried About Charity fundraiser in the Last Year: Never true   Ran Out of Food in the Last Year: Never true  Transportation Needs: No Transportation Needs   Lack of Transportation (Medical): No   Lack of  Transportation (Non-Medical): No  Physical Activity: Sufficiently Active   Days of Exercise per Week: 5 days   Minutes of Exercise per Session: 40 min  Stress: No Stress Concern Present   Feeling of Stress : Not at all  Social Connections: Socially Isolated   Frequency of Communication with Friends and Family: Twice a week   Frequency of Social Gatherings with Friends and Family: Three times a week   Attends Religious Services: Never   Active Member of Clubs or Organizations: No   Attends Archivist Meetings: Never   Marital Status: Divorced  Human resources officer Violence: Not At Risk   Fear of Current or Ex-Partner: No   Emotionally Abused: No   Physically Abused: No   Sexually Abused: No        BP 134/76    Pulse (!) 58    Ht 6\' 1"  (1.854 m)    Wt 220 lb (99.8 kg)    SpO2 91%    BMI 29.03 kg/m    Physical Exam:   Well appearing NAD HEENT: Unremarkable Neck:  No JVD, no thyromegally Lymphatics:  No adenopathy Back:  No CVA tenderness Lungs:  Clear with no wheezes HEART:  Regular rate rhythm, no murmurs, no rubs, no clicks Abd:  soft, positive bowel sounds, no organomegally, no rebound, no guarding Ext:  2 plus pulses, no edema, no cyanosis, no clubbing Skin:  No rashes no nodules Neuro:  CN II through XII intact, motor grossly intact   EKG - sinus brady   Assess/Plan:  Atrial flutter - he is currently back in NSR. I have discussed the indications including the risks/benefits/goals,expectations of catheter ablation and he will call if he wishes to proceed. HTN - her bp is controlled. No change in meds. Coags - he will continue his [place] after ablation.    Scott Overlie Willow Shidler,MD

## 2021-09-03 NOTE — Discharge Instructions (Signed)
Post procedure care instructions No driving for 4 days. No lifting over 5 lbs for 1 week. No vigorous or sexual activity for 1 week. You may return to work/your usual activities on 09/10/21. Keep procedure site clean & dry. If you notice increased pain, swelling, bleeding or pus, call/return!  You may shower after 24 hours, but no soaking in baths/hot tubs/pools for 1 week.

## 2021-09-03 NOTE — Progress Notes (Signed)
Pt received from cath lab, axOx4. Pt connected to telemetry monitor and found to be in Vtach at a rate of 250. Pt broke out on his own after a few seconds. Now NSR 70s. Vitals cycling and wnL. Groin C/D/I. Pt felt SOB during episode. Reaching out to Dr Lovena Le. Pt placed on ZOLL pads. Call bell placed within reach. Will continue to monitor and maintain safety.

## 2021-09-03 NOTE — Progress Notes (Addendum)
Site area: Right groin a 7 and 8 french venous sheath was removed  Site Prior to Removal:  Level 0  Pressure Applied For 20 MINUTES    Bedrest Beginning at 1520pm x 6 hours  Manual:   Yes.    Patient Status During Pull:  stable  Post Pull Groin Site:  Level 0  Post Pull Instructions Given:  Yes.    Post Pull Pulses Present:  Yes.    Dressing Applied:  Yes.    Comments:

## 2021-09-04 ENCOUNTER — Encounter (HOSPITAL_COMMUNITY): Payer: Self-pay | Admitting: Internal Medicine

## 2021-09-04 DIAGNOSIS — R911 Solitary pulmonary nodule: Secondary | ICD-10-CM | POA: Diagnosis not present

## 2021-09-04 DIAGNOSIS — Z20822 Contact with and (suspected) exposure to covid-19: Secondary | ICD-10-CM | POA: Diagnosis not present

## 2021-09-04 DIAGNOSIS — I10 Essential (primary) hypertension: Secondary | ICD-10-CM | POA: Diagnosis not present

## 2021-09-04 DIAGNOSIS — Z87891 Personal history of nicotine dependence: Secondary | ICD-10-CM | POA: Diagnosis not present

## 2021-09-04 DIAGNOSIS — J449 Chronic obstructive pulmonary disease, unspecified: Secondary | ICD-10-CM | POA: Diagnosis not present

## 2021-09-04 DIAGNOSIS — I483 Typical atrial flutter: Secondary | ICD-10-CM | POA: Diagnosis not present

## 2021-09-04 DIAGNOSIS — Z7901 Long term (current) use of anticoagulants: Secondary | ICD-10-CM | POA: Diagnosis not present

## 2021-09-04 DIAGNOSIS — I700 Atherosclerosis of aorta: Secondary | ICD-10-CM | POA: Diagnosis not present

## 2021-09-04 DIAGNOSIS — G4733 Obstructive sleep apnea (adult) (pediatric): Secondary | ICD-10-CM | POA: Diagnosis not present

## 2021-09-04 MED ORDER — APIXABAN 5 MG PO TABS
5.0000 mg | ORAL_TABLET | Freq: Two times a day (BID) | ORAL | Status: AC
  Administered 2021-09-04: 5 mg via ORAL
  Filled 2021-09-04: qty 1

## 2021-09-04 NOTE — Progress Notes (Signed)
° °  Progress Note  Patient Name: Scott Montoya Date of Encounter: 09/04/2021  Primary Cardiologist: None   Subjective   Minimal pleuritic chest pain.   Inpatient Medications    Scheduled Meds:  losartan  100 mg Oral Daily   sodium chloride flush  3 mL Intravenous Q12H   Continuous Infusions:  sodium chloride     PRN Meds: sodium chloride, acetaminophen, ondansetron (ZOFRAN) IV, sodium chloride flush   Vital Signs    Vitals:   09/03/21 1728 09/03/21 2144 09/04/21 0031 09/04/21 0419  BP: (!) 141/89 (!) 138/98 (!) 141/91 (!) 145/92  Pulse: 83 71 73 78  Resp:  20 19 18   Temp:  98.2 F (36.8 C) 98.5 F (36.9 C) 98.1 F (36.7 C)  TempSrc:  Oral Oral Oral  SpO2:  97% 98% 98%  Weight:      Height:        Intake/Output Summary (Last 24 hours) at 09/04/2021 3151 Last data filed at 09/03/2021 2146 Gross per 24 hour  Intake 383.33 ml  Output 300 ml  Net 83.33 ml   Filed Weights   09/03/21 1004  Weight: 96.8 kg    Telemetry    nsr - Personally Reviewed  ECG    nsr - Personally Reviewed  Physical Exam   GEN: No acute distress.   Neck: No JVD Cardiac: RRR, no murmurs, rubs, or gallops.  Respiratory: Clear to auscultation bilaterally. GI: Soft, nontender, non-distended  MS: No edema; No deformity. Neuro:  Nonfocal  Psych: Normal affect   Labs    ChemistryNo results for input(s): NA, K, CL, CO2, GLUCOSE, BUN, CREATININE, CALCIUM, PROT, ALBUMIN, AST, ALT, ALKPHOS, BILITOT, GFRNONAA, GFRAA, ANIONGAP in the last 168 hours.   HematologyNo results for input(s): WBC, RBC, HGB, HCT, MCV, MCH, MCHC, RDW, PLT in the last 168 hours.  Cardiac EnzymesNo results for input(s): TROPONINI in the last 168 hours. No results for input(s): TROPIPOC in the last 168 hours.   BNPNo results for input(s): BNP, PROBNP in the last 168 hours.   DDimer No results for input(s): DDIMER in the last 168 hours.   Radiology    EP STUDY  Result Date: 09/03/2021 CONCLUSIONS: 1.  Isthmus-dependent right atrial flutter upon presentation. 2. Successful radiofrequency ablation of atrial flutter along the cavotricuspid isthmus with complete bidirectional isthmus block achieved. 3. No inducible arrhythmias following ablation. 4. No early apparent complications. Cristopher Peru, MD 2:28 PM 09/03/2021    Cardiac Studies   See above EP study.  Patient Profile     66 y.o. male admitted with atrial flutter s/p EPS/RFA with return to NSR  Assessment & Plan    Atrial flutter - he is s/p successful ablation and will be discharged home with the usual followup. Continue [place].   For questions or updates, please contact [place] Please consult [email or web address] for contact info under Cardiology/STEMI.      Signed, Cristopher Peru, MD  09/04/2021, 9:07 AM

## 2021-09-04 NOTE — Progress Notes (Signed)
Per Leanor Kail, pt to take Eliquis prior to d/c this morning. Pharmacist notified.

## 2021-09-04 NOTE — Plan of Care (Signed)

## 2021-09-04 NOTE — Discharge Summary (Addendum)
Discharge Summary    Patient ID: Lawson Mahone MRN: [id number]; DOB: 1955/08/27  Admit date: 09/03/2021 Discharge date: 09/04/2021  PCP:  Fredirick Lathe, PA-C   [facility] Cardiologist:  Kirk Ruths, MD  Electrophysiologist:  Cristopher Peru, MD  {  Discharge Diagnoses    Principal Problem:   Atrial flutter ([place])   HTN  Diagnostic Studies/Procedures    A-FLUTTER ABLATION   Conclusion  CONCLUSIONS:  1. Isthmus-dependent right atrial flutter upon presentation.  2. Successful radiofrequency ablation of atrial flutter along the cavotricuspid isthmus with complete bidirectional isthmus block achieved.  3. No inducible arrhythmias following ablation.  4. No early apparent complications.    History of Present Illness     Scott Montoya is a 66 y.o. male with recent diagnosis of atrial flutter, hypertension, lung nodules and aortic atherosclerosis presented for outpatient ablation.   Abdominal CT December 2021 showed colonic diverticulosis, 4 mm nodule and aortic atherosclerosis; note no aneurysm noted.  Follow-up recommended 12 months.    Patient seen in the emergency room December 2 with atrial flutter.  TSH normal, potassium 4.0, creatinine 0.93, hemoglobin 14.2.  Patient underwent cardioversion and was discharged with apixaban.  Patient seen by Dr. Stanford Breed started amlodipine and started on Cardizem CD 240 mg daily.  Echocardiogram July 13, 2021 showed normal LV function without wall motion abnormality.  Grade 1 diastolic dysfunction.  Moderately dilated LA.   Establish care with Dr. Lovena Le January 2023 and catheter ablation recommended  Hospital Course     Consultants: None   The patient presented and underwent successful radiofrequency ablation of atrial flutter.  No overnight complications.  Mild pleuritic chest pain next day.  Patient was seen and examined by Dr. Lovena Le and deemed stable for discharge.  Plan to continue anticoagulation and  Cardizem. No change in home medications.   Did the patient have an acute coronary syndrome (MI, NSTEMI, STEMI, etc) this admission?:  No                               Did the patient have a percutaneous coronary intervention (stent / angioplasty)?:  No.    Discharge Vitals Blood pressure (!) 145/92, pulse 78, temperature 98.1 F (36.7 C), temperature source Oral, resp. rate 18, height 6\' 1"  (1.854 m), weight 96.8 kg, SpO2 98 %.  Filed Weights   09/03/21 1004  Weight: 96.8 kg    Labs & Radiologic Studies    _____________  EP STUDY  Result Date: 09/03/2021 CONCLUSIONS: 1. Isthmus-dependent right atrial flutter upon presentation. 2. Successful radiofrequency ablation of atrial flutter along the cavotricuspid isthmus with complete bidirectional isthmus block achieved. 3. No inducible arrhythmias following ablation. 4. No early apparent complications. Cristopher Peru, MD 2:28 PM 09/03/2021   Disposition   Pt is being discharged home today in good condition.  Follow-up Plans & Appointments     Follow-up Information     Evans Lance, MD Follow up on 10/08/2021.   Specialty: Cardiology Why: @3 :15 for follow up. Please arrive 15 minutes early Contact information: [address] [id number] [phone]                Discharge Instructions     Diet - low sodium heart healthy   Complete by: As directed    Increase activity slowly   Complete by: As directed        Discharge Medications   Allergies as  of 09/04/2021       Reactions   Codeine Other (See Comments)    dry mouth   Sulfa Antibiotics Nausea And Vomiting   Childhood reaction.        Medication List     TAKE these medications    albuterol 108 (90 Base) MCG/ACT inhaler Commonly known as: VENTOLIN HFA Inhale 1 puff into the lungs every 6 (six) hours as needed for wheezing or shortness of breath.   apixaban 5 MG Tabs tablet Commonly known as: ELIQUIS Take 1 tablet (5 mg  total) by mouth 2 (two) times daily.   chlorthalidone 25 MG tablet Commonly known as: HYGROTON Take 0.5 tablets (12.5 mg total) by mouth daily.   cholecalciferol 25 MCG (1000 UNIT) tablet Commonly known as: VITAMIN D3 Take 1,000 Units by mouth in the morning.   diltiazem 240 MG 24 hr capsule Commonly known as: CARDIZEM CD Take 1 capsule (240 mg total) by mouth daily.   Fish Oil 1000 MG Caps Take 1,000 mg by mouth in the morning.   losartan 100 MG tablet Commonly known as: COZAAR Take 1 tablet (100 mg total) by mouth daily.   Lubricant Eye Drops 0.4-0.3 % Soln Generic drug: Polyethyl Glycol-Propyl Glycol Place 1 drop into both eyes 3 (three) times daily as needed (dry/irritated eyes.).   OSTEO BI-FLEX ONE PER DAY PO Take 1 tablet by mouth in the morning.   PRESERVISION AREDS PO Take 1 tablet by mouth in the morning.   QC TUMERIC COMPLEX PO Take 1 capsule by mouth in the morning.   rosuvastatin 40 MG tablet Commonly known as: CRESTOR Take 1 tablet (40 mg total) by mouth daily.   VITAMIN B-12 PO Take 1 tablet by mouth in the morning.   Zinc 50 MG Caps Take 50 mg by mouth in the morning.           Outstanding Labs/Studies   None  Duration of Discharge Encounter   Greater than 30 minutes including physician time.  Jarrett Soho, PA 09/04/2021, 9:46 AM

## 2021-09-06 ENCOUNTER — Encounter (HOSPITAL_COMMUNITY): Payer: Self-pay | Admitting: Internal Medicine

## 2021-09-06 MED FILL — Bupivacaine HCl Preservative Free (PF) Inj 0.25%: INTRAMUSCULAR | Qty: 30 | Status: AC

## 2021-09-06 MED FILL — Heparin Sod (Porcine)-NaCl IV Soln 1000 Unit/500ML-0.9%: INTRAVENOUS | Qty: 500 | Status: AC

## 2021-09-13 DIAGNOSIS — G4733 Obstructive sleep apnea (adult) (pediatric): Secondary | ICD-10-CM | POA: Diagnosis not present

## 2021-10-05 ENCOUNTER — Encounter: Payer: Self-pay | Admitting: *Deleted

## 2021-10-05 ENCOUNTER — Other Ambulatory Visit: Payer: Self-pay | Admitting: *Deleted

## 2021-10-05 DIAGNOSIS — I2510 Atherosclerotic heart disease of native coronary artery without angina pectoris: Secondary | ICD-10-CM

## 2021-10-08 ENCOUNTER — Encounter: Payer: Self-pay | Admitting: Internal Medicine

## 2021-10-08 ENCOUNTER — Other Ambulatory Visit: Payer: Self-pay

## 2021-10-08 ENCOUNTER — Ambulatory Visit: Payer: Medicare HMO | Admitting: Internal Medicine

## 2021-10-08 VITALS — BP 114/70 | HR 75 | Ht 73.0 in | Wt 210.0 lb

## 2021-10-08 DIAGNOSIS — I483 Typical atrial flutter: Secondary | ICD-10-CM | POA: Diagnosis not present

## 2021-10-08 NOTE — Progress Notes (Signed)
? ? ? ? ?HPI ?Scott Montoya returns for ongoing followup of atrial flutter. He is a pleasant 66 yo man with atrial flutter who underwent EP study and catheter ablation several weeks ago. He has done well in the interim. He is bothered by peripheral edema.  ?Allergies  ?Allergen Reactions  ? Codeine Other (See Comments)  ?   dry mouth  ? Sulfa Antibiotics Nausea And Vomiting  ?  Childhood reaction.  ? ? ? ?Current Outpatient Medications  ?Medication Sig Dispense Refill  ? Boswellia-Glucosamine-Vit D (OSTEO BI-FLEX ONE PER DAY PO) Take 1 tablet by mouth in the morning.    ? chlorthalidone (HYGROTON) 25 MG tablet Take 0.5 tablets (12.5 mg total) by mouth daily. 45 tablet 3  ? cholecalciferol (VITAMIN D3) 25 MCG (1000 UNIT) tablet Take 1,000 Units by mouth in the morning.    ? Cyanocobalamin (VITAMIN B-12 PO) Take 1 tablet by mouth in the morning.    ? losartan (COZAAR) 100 MG tablet Take 1 tablet (100 mg total) by mouth daily. 90 tablet 2  ? Multiple Vitamins-Minerals (PRESERVISION AREDS PO) Take 1 tablet by mouth in the morning.    ? Omega-3 Fatty Acids (FISH OIL) 1000 MG CAPS Take 1,000 mg by mouth in the morning.    ? Polyethyl Glycol-Propyl Glycol (LUBRICANT EYE DROPS) 0.4-0.3 % SOLN Place 1 drop into both eyes 3 (three) times daily as needed (dry/irritated eyes.).    ? rosuvastatin (CRESTOR) 40 MG tablet Take 1 tablet (40 mg total) by mouth daily. 90 tablet 3  ? Turmeric (QC TUMERIC COMPLEX PO) Take 1 capsule by mouth in the morning.    ? Zinc 50 MG CAPS Take 50 mg by mouth in the morning.    ? albuterol (VENTOLIN HFA) 108 (90 Base) MCG/ACT inhaler Inhale 1 puff into the lungs every 6 (six) hours as needed for wheezing or shortness of breath. 8 g 2  ? diltiazem (CARDIZEM CD) 240 MG 24 hr capsule Take 1 capsule (240 mg total) by mouth daily. 90 capsule 3  ? ?No current facility-administered medications for this visit.  ? ? ? ?Past Medical History:  ?Diagnosis Date  ? Atrial flutter ([place])   ? Basal cell carcinoma  09/09/2020  ? sup-left upper back (CX35FU)- PCP did bx  ? Cataract   ? COPD (chronic obstructive pulmonary disease) ([place])   ? Hypertension   ? OSA (obstructive sleep apnea)   ? ? ?ROS: ? ? All systems reviewed and negative except as noted in the HPI. ? ? ?Past Surgical History:  ?Procedure Laterality Date  ? A-FLUTTER ABLATION N/A 09/03/2021  ? Procedure: A-FLUTTER ABLATION;  Surgeon: Evans Lance, MD;  Location: [facility];  Service: Cardiovascular;  Laterality: N/A;  ? CARPAL TUNNEL RELEASE    ? EYE SURGERY    ? foot heel repair Bilateral 2013  ? HEEL SPUR SURGERY    ? IR FL GUIDED LOC OF NEEDLE/CATH TIP FOR SPINAL INJECTION LT    ? ORBITAL RECONSTRUCTION  1994  ? ROTATOR CUFF REPAIR Right 2018  ? ? ? ?Family History  ?Problem Relation Age of Onset  ? Heart attack Father   ? Cancer Maternal Grandmother   ? Cancer Maternal Grandfather   ? Heart attack Paternal Grandfather   ? ? ? ?Social History  ? ?Socioeconomic History  ? Marital status: Divorced  ?  Spouse name: Not on file  ? Number of children: 3  ? Years of education: Not on file  ?  Highest education level: Not on file  ?Occupational History  ? Not on file  ?Tobacco Use  ? Smoking status: Former  ?  Types: Cigarettes  ? Smokeless tobacco: Never  ?Vaping Use  ? Vaping Use: Some days  ? Devices: Juul  ?Substance and Sexual Activity  ? Alcohol use: Yes  ?  Alcohol/week: 28.0 standard drinks  ?  Types: 14 Glasses of wine, 14 Cans of beer per week  ?  Comment: 4 drinks per day  ? Drug use: Not Currently  ?  Types: Marijuana  ? Sexual activity: Yes  ?  Birth control/protection: None  ?Other Topics Concern  ? Not on file  ?Social History Narrative  ? Not on file  ? ?Social Determinants of Health  ? ?Financial Resource Strain: Low Risk   ? Difficulty of Paying Living Expenses: Not hard at all  ?Food Insecurity: No Food Insecurity  ? Worried About Charity fundraiser in the Last Year: Never true  ? Ran Out of Food in the Last Year: Never true   ?Transportation Needs: No Transportation Needs  ? Lack of Transportation (Medical): No  ? Lack of Transportation (Non-Medical): No  ?Physical Activity: Sufficiently Active  ? Days of Exercise per Week: 5 days  ? Minutes of Exercise per Session: 40 min  ?Stress: No Stress Concern Present  ? Feeling of Stress : Not at all  ?Social Connections: Socially Isolated  ? Frequency of Communication with Friends and Family: Twice a week  ? Frequency of Social Gatherings with Friends and Family: Three times a week  ? Attends Religious Services: Never  ? Active Member of Clubs or Organizations: No  ? Attends Archivist Meetings: Never  ? Marital Status: Divorced  ?Intimate Partner Violence: Not At Risk  ? Fear of Current or Ex-Partner: No  ? Emotionally Abused: No  ? Physically Abused: No  ? Sexually Abused: No  ? ? ? ?BP 114/70   Pulse 75   Ht '6\' 1"'$  (1.854 m)   Wt 210 lb (95.3 kg)   SpO2 92%   BMI 27.71 kg/m?  ? ?Physical Exam: ? ?Well appearing NAD ?HEENT: Unremarkable ?Neck:  No JVD, no thyromegally ?Lymphatics:  No adenopathy ?Back:  No CVA tenderness ?Lungs:  Clear with no wheezes ?HEART:  Regular rate rhythm, no murmurs, no rubs, no clicks ?Abd:  soft, positive bowel sounds, no organomegally, no rebound, no guarding ?Ext:  2 plus pulses, no edema, no cyanosis, no clubbing ?Skin:  No rashes no nodules ?Neuro:  CN II through XII intact, motor grossly intact ? ?EKG - nsr ? ? ?Assess/Plan:  ?Atrial flutter - he is maintaining NSR. No change in his meds s/p EPS/RFA.  ?Coags - he will stop his eliquis. ? ?Carleene Overlie Deaven [place]ron,MD ?

## 2021-10-08 NOTE — Patient Instructions (Signed)
Medication Instructions:  ?Your physician has recommended you make the following change in your medication:  ? ? STOP Eliquis ? ?Labwork: ?None ordered. ? ?Testing/Procedures: ?None ordered. ? ?Follow-Up: ?Your physician wants you to follow-up in: as needed with Cristopher Peru, MD  ? ? ?Any Other Special Instructions Will Be Listed Below (If Applicable). ? ?If you need a refill on your cardiac medications before your next appointment, please call your pharmacy.  ? ? ? ? ?

## 2021-10-12 ENCOUNTER — Other Ambulatory Visit: Payer: Self-pay

## 2021-10-12 ENCOUNTER — Other Ambulatory Visit: Payer: Medicare HMO

## 2021-10-12 DIAGNOSIS — I2510 Atherosclerotic heart disease of native coronary artery without angina pectoris: Secondary | ICD-10-CM | POA: Diagnosis not present

## 2021-10-12 DIAGNOSIS — I2584 Coronary atherosclerosis due to calcified coronary lesion: Secondary | ICD-10-CM | POA: Diagnosis not present

## 2021-10-12 LAB — LIPID PANEL
Chol/HDL Ratio: 2 ratio (ref 0.0–5.0)
Cholesterol, Total: 133 mg/dL (ref 100–199)
HDL: 66 mg/dL (ref >39)
LDL Chol Calc (NIH): 54 mg/dL (ref 0–99)
Triglycerides: 60 mg/dL (ref 0–149)
VLDL Cholesterol Cal: 13 mg/dL (ref 5–40)

## 2021-10-12 LAB — HEPATIC FUNCTION PANEL
ALT: 43 IU/L (ref 0–44)
AST: 38 IU/L (ref 0–40)
Albumin: 4.8 g/dL (ref 3.8–4.8)
Alkaline Phosphatase: 64 IU/L (ref 44–121)
Bilirubin Total: 0.9 mg/dL (ref 0.0–1.2)
Bilirubin, Direct: 0.3 mg/dL (ref 0.00–0.40)
Total Protein: 7.3 g/dL (ref 6.0–8.5)

## 2021-10-14 DIAGNOSIS — G4733 Obstructive sleep apnea (adult) (pediatric): Secondary | ICD-10-CM | POA: Diagnosis not present

## 2021-10-14 DIAGNOSIS — M9902 Segmental and somatic dysfunction of thoracic region: Secondary | ICD-10-CM | POA: Diagnosis not present

## 2021-10-14 DIAGNOSIS — M5417 Radiculopathy, lumbosacral region: Secondary | ICD-10-CM | POA: Diagnosis not present

## 2021-10-14 DIAGNOSIS — M5135 Other intervertebral disc degeneration, thoracolumbar region: Secondary | ICD-10-CM | POA: Diagnosis not present

## 2021-10-14 DIAGNOSIS — M9904 Segmental and somatic dysfunction of sacral region: Secondary | ICD-10-CM | POA: Diagnosis not present

## 2021-10-14 DIAGNOSIS — M9903 Segmental and somatic dysfunction of lumbar region: Secondary | ICD-10-CM | POA: Diagnosis not present

## 2021-10-14 DIAGNOSIS — M5136 Other intervertebral disc degeneration, lumbar region: Secondary | ICD-10-CM | POA: Diagnosis not present

## 2021-10-15 DIAGNOSIS — M9903 Segmental and somatic dysfunction of lumbar region: Secondary | ICD-10-CM | POA: Diagnosis not present

## 2021-10-15 DIAGNOSIS — M9904 Segmental and somatic dysfunction of sacral region: Secondary | ICD-10-CM | POA: Diagnosis not present

## 2021-10-15 DIAGNOSIS — M5136 Other intervertebral disc degeneration, lumbar region: Secondary | ICD-10-CM | POA: Diagnosis not present

## 2021-10-15 DIAGNOSIS — M5417 Radiculopathy, lumbosacral region: Secondary | ICD-10-CM | POA: Diagnosis not present

## 2021-10-15 DIAGNOSIS — M5135 Other intervertebral disc degeneration, thoracolumbar region: Secondary | ICD-10-CM | POA: Diagnosis not present

## 2021-10-15 DIAGNOSIS — M9902 Segmental and somatic dysfunction of thoracic region: Secondary | ICD-10-CM | POA: Diagnosis not present

## 2021-10-19 DIAGNOSIS — M9902 Segmental and somatic dysfunction of thoracic region: Secondary | ICD-10-CM | POA: Diagnosis not present

## 2021-10-19 DIAGNOSIS — M9904 Segmental and somatic dysfunction of sacral region: Secondary | ICD-10-CM | POA: Diagnosis not present

## 2021-10-19 DIAGNOSIS — M9903 Segmental and somatic dysfunction of lumbar region: Secondary | ICD-10-CM | POA: Diagnosis not present

## 2021-10-19 DIAGNOSIS — M5135 Other intervertebral disc degeneration, thoracolumbar region: Secondary | ICD-10-CM | POA: Diagnosis not present

## 2021-10-19 DIAGNOSIS — M5136 Other intervertebral disc degeneration, lumbar region: Secondary | ICD-10-CM | POA: Diagnosis not present

## 2021-10-19 DIAGNOSIS — M5417 Radiculopathy, lumbosacral region: Secondary | ICD-10-CM | POA: Diagnosis not present

## 2021-10-20 IMAGING — CT CT RENAL STONE PROTOCOL
2 of 4 series · 16 of 46 positions shown, 18 images · non-contrast
Comparison: CT pelvis from [REDACTED] dated 07/08/2015

CLINICAL DATA: Right flank and back pain.

EXAM:
CT ABDOMEN AND PELVIS WITHOUT CONTRAST
TECHNIQUE: Multidetector CT imaging of the abdomen and pelvis was performed
following the standard protocol without IV contrast.

[Series 3: renal stone 5.0 · axial · 0.82mm/px · z∈[+820,+1265]mm · 13 of 97 slices shown, 15 images]
[im 4/97  soft-tissue]
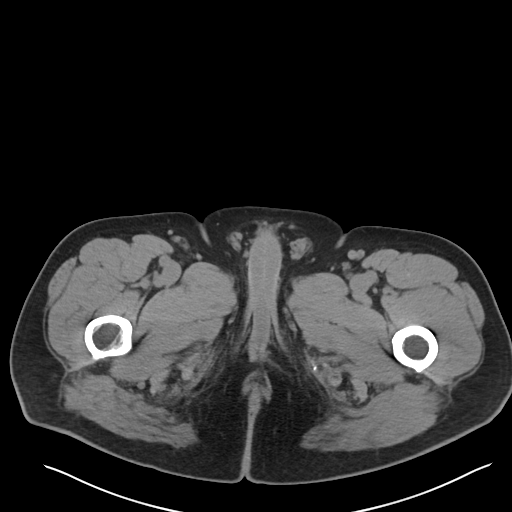
[im 4/97  bone]
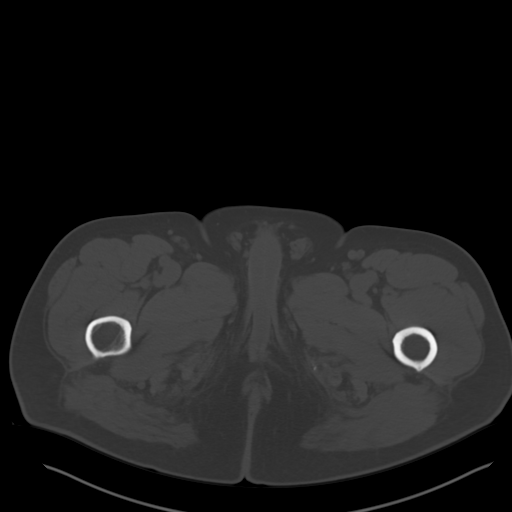
[im 12/97  soft-tissue]
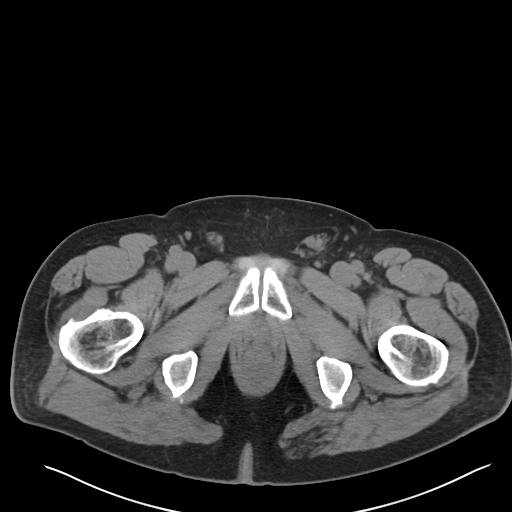
[im 19/97  soft-tissue]
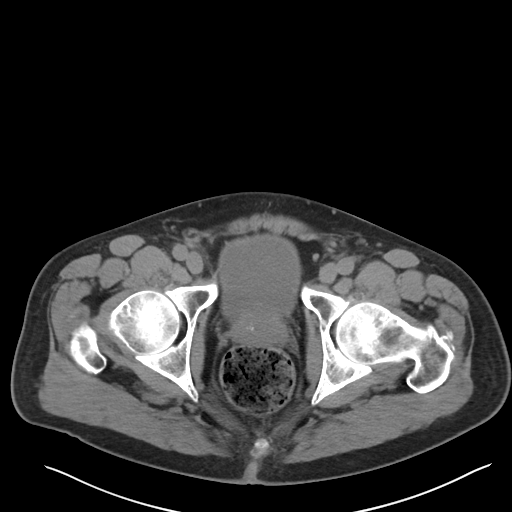
[im 26/97  soft-tissue]
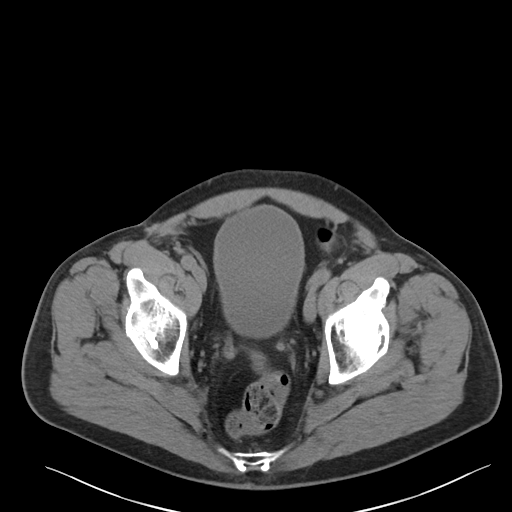
[im 34/97  soft-tissue]
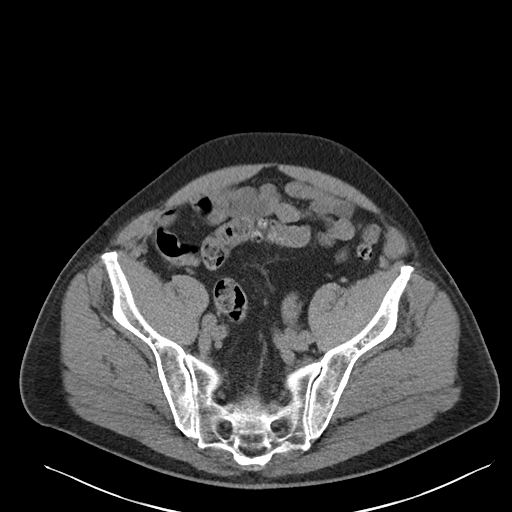
[im 41/97  soft-tissue]
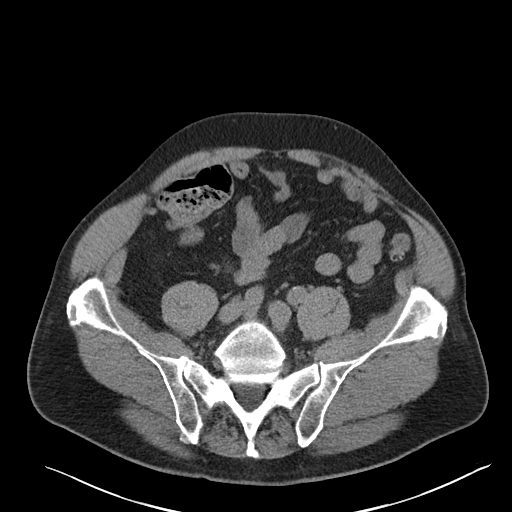
[im 49/97  soft-tissue]
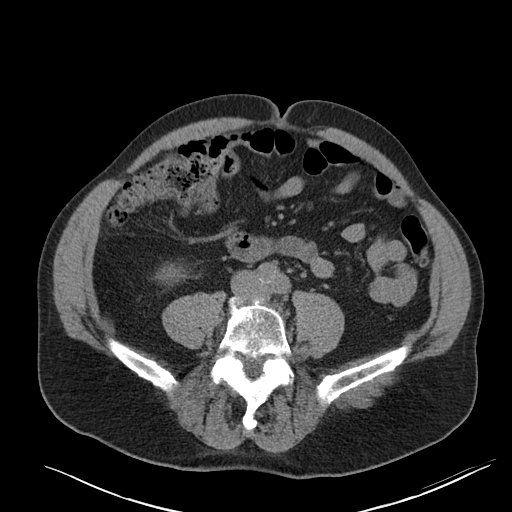
[im 56/97  soft-tissue]
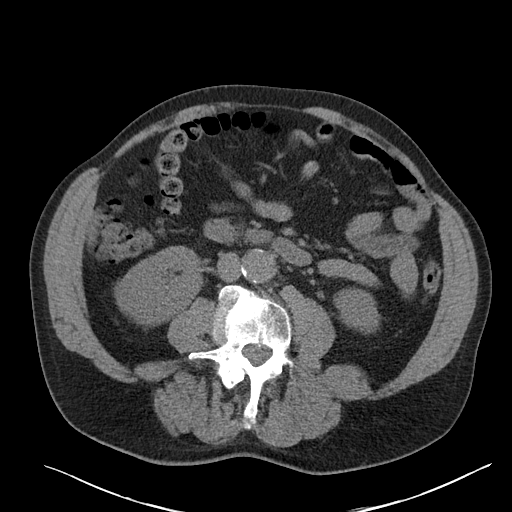
[im 63/97  soft-tissue]
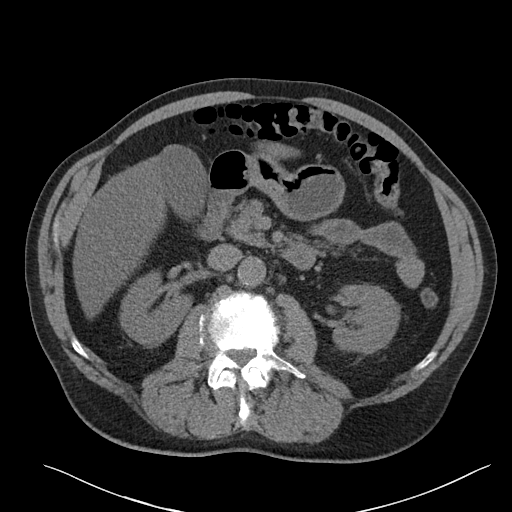
[im 63/97  bone]
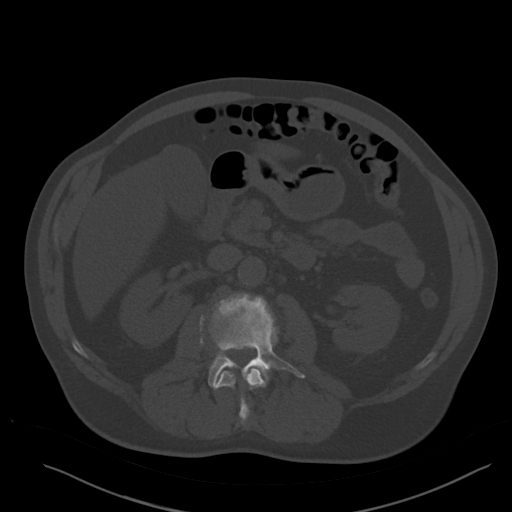
[im 71/97  soft-tissue]
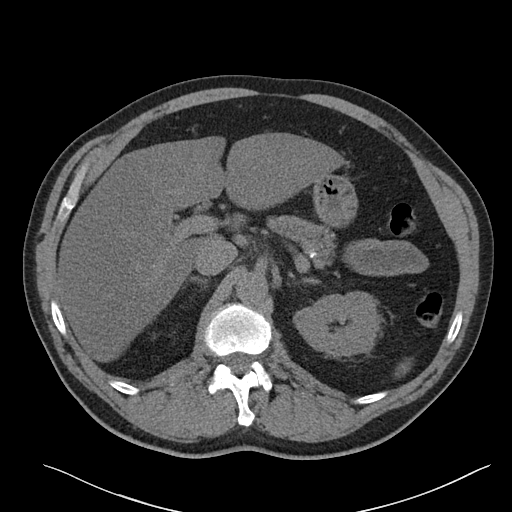
[im 78/97  soft-tissue]
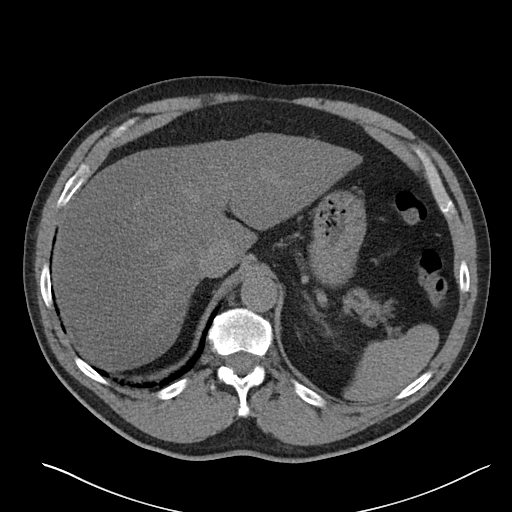
[im 85/97  soft-tissue]
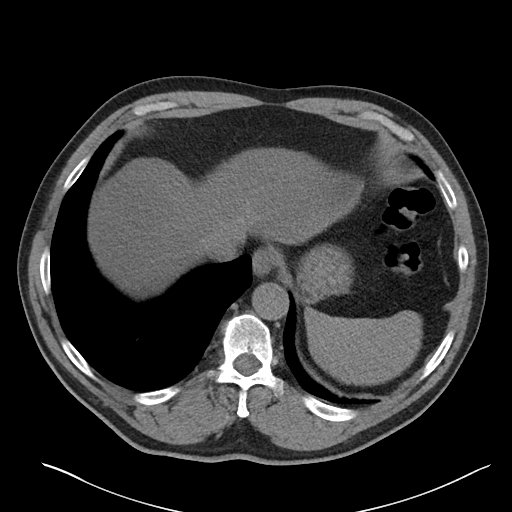
[im 93/97  soft-tissue]
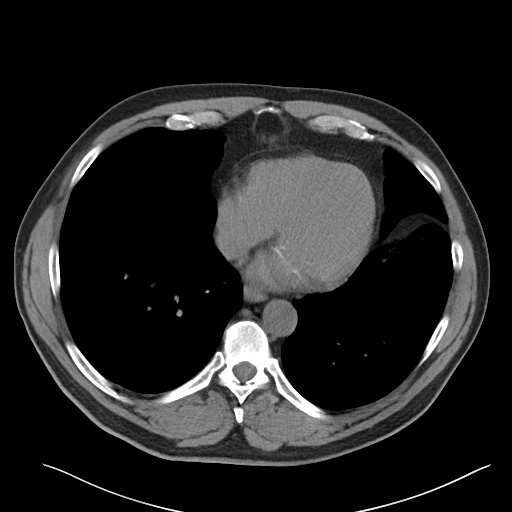

[Series 5: coronal · coronal · 0.79mm/px · 3 of 107 slices shown]
[im 36/107  soft-tissue]
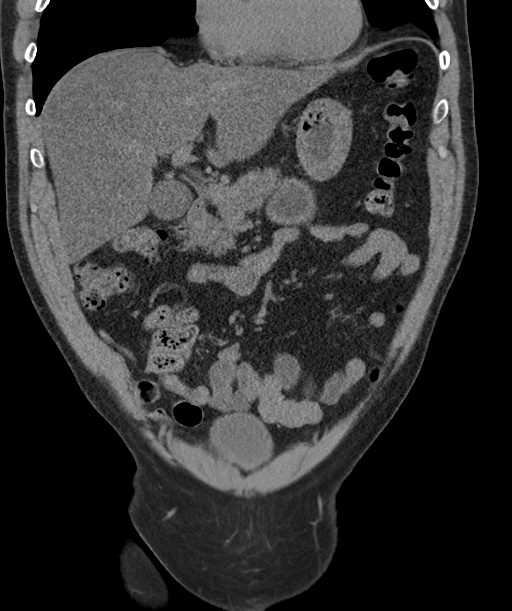
[im 48/107  soft-tissue]
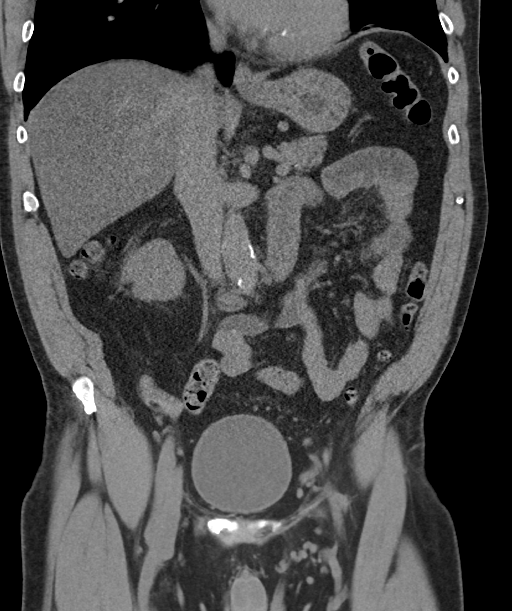
[im 59/107  soft-tissue]
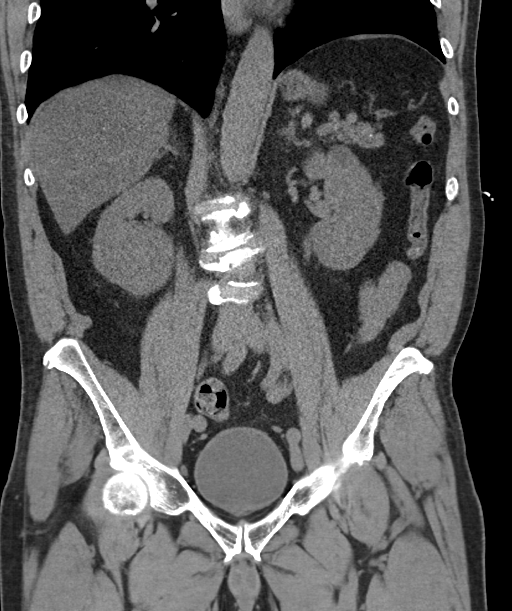

[16 of 46 positions shown; findings below may reference images not displayed]

FINDINGS: Lower chest: Several small basilar left lower lobe lung nodules with
the largest measuring 4 mm (series 7, image 4). No pleural effusion.
Normal heart size.

Hepatobiliary: Hepatic steatosis. Unremarkable gallbladder. No
biliary dilatation.

Pancreas: Scattered small calcifications in the pancreatic body and
tail. No pancreatic ductal dilatation or peripancreatic
inflammation.

Spleen: Unremarkable.

Adrenals/Urinary Tract: Unremarkable adrenal glands. Mild
nonspecific perinephric stranding bilaterally. 1.2 cm hypodensity in
the upper pole of the left kidney consistent with a cyst. No renal
calculi or hydronephrosis. Unremarkable bladder.

Stomach/Bowel: The stomach is unremarkable. There is no evidence of
bowel obstruction. There is diverticulosis of the descending and
sigmoid colon without evidence of acute diverticulitis. The appendix
is unremarkable.

Vascular/Lymphatic: Abdominal aortic atherosclerosis without
aneurysm. No enlarged lymph nodes.

Reproductive: Mildly enlarged prostate.

Other: No intraperitoneal free fluid.

Musculoskeletal: 1 cm sclerotic focus along the superior aspect of
the right acetabulum, possibly a bone island. Moderate levoscoliosis
and advanced disc and facet degeneration in the lumbar spine with
moderate to severe multilevel neural foraminal stenosis and moderate
multilevel spinal stenosis.
IMPRESSION: 1. No acute abnormality identified in the abdomen or pelvis. No
urolithiasis.
2. Hepatic steatosis.
3. Colonic diverticulosis.
4. Small basilar lung nodules measuring up to 4 mm. No follow-up
needed if patient is low-risk (and has no known or suspected primary
neoplasm). Non-contrast chest CT can be considered in 12 months if
patient is high-risk. This recommendation follows the consensus
statement: Guidelines for Management of Incidental Pulmonary Nodules
Detected on CT Images: From the [HOSPITAL] 7231; Radiology
5. Aortic Atherosclerosis (MLQ7B-TZ1.1).

## 2021-10-21 DIAGNOSIS — M5135 Other intervertebral disc degeneration, thoracolumbar region: Secondary | ICD-10-CM | POA: Diagnosis not present

## 2021-10-21 DIAGNOSIS — M9903 Segmental and somatic dysfunction of lumbar region: Secondary | ICD-10-CM | POA: Diagnosis not present

## 2021-10-21 DIAGNOSIS — M9902 Segmental and somatic dysfunction of thoracic region: Secondary | ICD-10-CM | POA: Diagnosis not present

## 2021-10-21 DIAGNOSIS — M5417 Radiculopathy, lumbosacral region: Secondary | ICD-10-CM | POA: Diagnosis not present

## 2021-10-21 DIAGNOSIS — M5136 Other intervertebral disc degeneration, lumbar region: Secondary | ICD-10-CM | POA: Diagnosis not present

## 2021-10-21 DIAGNOSIS — M9904 Segmental and somatic dysfunction of sacral region: Secondary | ICD-10-CM | POA: Diagnosis not present

## 2021-10-25 DIAGNOSIS — M9902 Segmental and somatic dysfunction of thoracic region: Secondary | ICD-10-CM | POA: Diagnosis not present

## 2021-10-25 DIAGNOSIS — M9904 Segmental and somatic dysfunction of sacral region: Secondary | ICD-10-CM | POA: Diagnosis not present

## 2021-10-25 DIAGNOSIS — M5135 Other intervertebral disc degeneration, thoracolumbar region: Secondary | ICD-10-CM | POA: Diagnosis not present

## 2021-10-25 DIAGNOSIS — M9903 Segmental and somatic dysfunction of lumbar region: Secondary | ICD-10-CM | POA: Diagnosis not present

## 2021-10-25 DIAGNOSIS — M5417 Radiculopathy, lumbosacral region: Secondary | ICD-10-CM | POA: Diagnosis not present

## 2021-10-25 DIAGNOSIS — M5136 Other intervertebral disc degeneration, lumbar region: Secondary | ICD-10-CM | POA: Diagnosis not present

## 2021-10-28 DIAGNOSIS — M5136 Other intervertebral disc degeneration, lumbar region: Secondary | ICD-10-CM | POA: Diagnosis not present

## 2021-10-28 DIAGNOSIS — M9902 Segmental and somatic dysfunction of thoracic region: Secondary | ICD-10-CM | POA: Diagnosis not present

## 2021-10-28 DIAGNOSIS — M5135 Other intervertebral disc degeneration, thoracolumbar region: Secondary | ICD-10-CM | POA: Diagnosis not present

## 2021-10-28 DIAGNOSIS — M9903 Segmental and somatic dysfunction of lumbar region: Secondary | ICD-10-CM | POA: Diagnosis not present

## 2021-10-28 DIAGNOSIS — M5417 Radiculopathy, lumbosacral region: Secondary | ICD-10-CM | POA: Diagnosis not present

## 2021-10-28 DIAGNOSIS — M9904 Segmental and somatic dysfunction of sacral region: Secondary | ICD-10-CM | POA: Diagnosis not present

## 2022-01-11 DIAGNOSIS — E785 Hyperlipidemia, unspecified: Secondary | ICD-10-CM | POA: Diagnosis not present

## 2022-01-11 DIAGNOSIS — I10 Essential (primary) hypertension: Secondary | ICD-10-CM | POA: Diagnosis not present

## 2022-01-11 DIAGNOSIS — I2510 Atherosclerotic heart disease of native coronary artery without angina pectoris: Secondary | ICD-10-CM | POA: Diagnosis not present

## 2022-01-22 ENCOUNTER — Encounter (HOSPITAL_BASED_OUTPATIENT_CLINIC_OR_DEPARTMENT_OTHER): Payer: Self-pay

## 2022-01-22 ENCOUNTER — Emergency Department (HOSPITAL_BASED_OUTPATIENT_CLINIC_OR_DEPARTMENT_OTHER)
Admission: EM | Admit: 2022-01-22 | Discharge: 2022-01-22 | Disposition: A | Discharge status: Home / Self Care | Payer: Medicare HMO | Attending: Emergency Medicine | Admitting: Emergency Medicine

## 2022-01-22 DIAGNOSIS — W268XXA Contact with other sharp object(s), not elsewhere classified, initial encounter: Secondary | ICD-10-CM | POA: Insufficient documentation

## 2022-01-22 DIAGNOSIS — Z23 Encounter for immunization: Secondary | ICD-10-CM | POA: Insufficient documentation

## 2022-01-22 DIAGNOSIS — S6991XA Unspecified injury of right wrist, hand and finger(s), initial encounter: Secondary | ICD-10-CM | POA: Diagnosis not present

## 2022-01-22 DIAGNOSIS — S61011A Laceration without foreign body of right thumb without damage to nail, initial encounter: Secondary | ICD-10-CM | POA: Insufficient documentation

## 2022-01-22 DIAGNOSIS — Z79899 Other long term (current) drug therapy: Secondary | ICD-10-CM | POA: Insufficient documentation

## 2022-01-22 DIAGNOSIS — S61102A Unspecified open wound of left thumb with damage to nail, initial encounter: Secondary | ICD-10-CM | POA: Diagnosis not present

## 2022-01-22 DIAGNOSIS — T148XXA Other injury of unspecified body region, initial encounter: Secondary | ICD-10-CM

## 2022-01-22 MED ORDER — TETANUS-DIPHTH-ACELL PERTUSSIS 5-2.5-18.5 LF-MCG/0.5 IM SUSY
0.5000 mL | PREFILLED_SYRINGE | Freq: Once | INTRAMUSCULAR | Status: AC
  Administered 2022-01-22: 0.5 mL via INTRAMUSCULAR
  Filled 2022-01-22: qty 0.5

## 2022-01-22 MED ORDER — CEPHALEXIN 500 MG PO CAPS: 500.0000 mg | ORAL_CAPSULE | Freq: Two times a day (BID) | ORAL | 0 refills | Status: AC

## 2022-01-22 NOTE — Discharge Instructions (Signed)
Recommend keeping this area clean and dry for at least 7 days.  Take antibiotics to help prevent infection.  Follow-up with hand team if any concerns for infection, not healing well.

## 2022-01-22 NOTE — ED Triage Notes (Signed)
He states he lac. His lateral distal right thumb while using a mandolin vegetable cutting device this morning. He has an avulsion-type wound at that area. Dr. Ronnald Nian meets pt. And immediately begins cleaning/treating his wound.

## 2022-01-22 NOTE — ED Provider Notes (Signed)
[facility] EMERGENCY DEPT Provider Note   CSN: [id number] Arrival date & time: 01/22/22  5053     History  Chief Complaint  Patient presents with   finger laceration    Scott Montoya is a 66 y.o. male.  Pt with no significant med hx, injury to right thumb, sliced the skin of his right thumb with mandolin. Tdap not up to date. No weakness, numbness, chills.   The history is provided by the patient.       Home Medications Prior to Admission medications   Medication Sig Start Date End Date Taking? Authorizing Provider  cephALEXin (KEFLEX) 500 MG capsule Take 1 capsule (500 mg total) by mouth 2 (two) times daily for 5 days. 01/22/22 01/27/22 Yes Jaylissa Felty, DO  albuterol (VENTOLIN HFA) 108 (90 Base) MCG/ACT inhaler Inhale 1 puff into the lungs every 6 (six) hours as needed for wheezing or shortness of breath. 09/01/21 10/01/21  Allwardt, Randa Evens, PA-C  Boswellia-Glucosamine-Vit D (OSTEO BI-FLEX ONE PER DAY PO) Take 1 tablet by mouth in the morning.    [provider]  chlorthalidone (HYGROTON) 25 MG tablet Take 0.5 tablets (12.5 mg total) by mouth daily. 08/10/21 11/08/21  Lelon Perla, MD  cholecalciferol (VITAMIN D3) 25 MCG (1000 UNIT) tablet Take 1,000 Units by mouth in the morning.    [provider]  Cyanocobalamin (VITAMIN B-12 PO) Take 1 tablet by mouth in the morning.    [provider]  diltiazem (CARDIZEM CD) 240 MG 24 hr capsule Take 1 capsule (240 mg total) by mouth daily. 06/22/21 09/20/21  Lelon Perla, MD  losartan (COZAAR) 100 MG tablet Take 1 tablet (100 mg total) by mouth daily. 04/13/21   Allwardt, Randa Evens, PA-C  Multiple Vitamins-Minerals (PRESERVISION AREDS PO) Take 1 tablet by mouth in the morning.    [provider]  Omega-3 Fatty Acids (FISH OIL) 1000 MG CAPS Take 1,000 mg by mouth in the morning.    [provider]  Polyethyl Glycol-Propyl Glycol (LUBRICANT EYE DROPS) 0.4-0.3 % SOLN Place 1 drop  into both eyes 3 (three) times daily as needed (dry/irritated eyes.).    [provider]  rosuvastatin (CRESTOR) 40 MG tablet Take 1 tablet (40 mg total) by mouth daily. 07/15/21 10/13/21  Lelon Perla, MD  Turmeric (QC TUMERIC COMPLEX PO) Take 1 capsule by mouth in the morning.    [provider]  Zinc 50 MG CAPS Take 50 mg by mouth in the morning.    [provider]      Allergies    Codeine and Sulfa antibiotics    Review of Systems   Review of Systems  Physical Exam Updated Vital Signs BP (!) 160/91 (BP Location: Left Arm)   Pulse 75   Temp 98.3 F (36.8 C) (Oral)   Resp 16   SpO2 96%  Physical Exam Constitutional:      General: He is not in acute distress.    Appearance: He is not ill-appearing.  Cardiovascular:     Pulses: Normal pulses.  Musculoskeletal:        General: No swelling or tenderness. Normal range of motion.  Skin:    General: Skin is warm.     Comments: Skin avulsion to right thumb but does not involve nail bed, not hemostatic   Neurological:     Mental Status: He is alert.     Sensory: No sensory deficit.     Motor: No weakness.  ED Results / Procedures / Treatments   Labs (all labs ordered are listed, but only abnormal results are displayed) Labs Reviewed - No data to display  EKG None  Radiology No results found.  Procedures .Marland KitchenLaceration Repair  Date/Time: 01/22/2022 8:31 AM  Performed by: Lennice Sites, DO Authorized by: Lennice Sites, DO   Consent:    Consent obtained:  Verbal   Consent given by:  Patient   Risks, benefits, and alternatives were discussed: yes     Risks discussed:  Infection, nerve damage, need for additional repair, pain, poor cosmetic result, poor wound healing, retained foreign body, tendon damage and vascular damage   Alternatives discussed:  No treatment Anesthesia:    Anesthesia method:  None Laceration details:    Location:  Finger   Finger location:  R thumb   Wound  length (cm): skin avulsion. Pre-procedure details:    Preparation:  Patient was prepped and draped in usual sterile fashion Exploration:    Hemostasis achieved with:  Tourniquet   Wound extent: no areolar tissue violation noted, no fascia violation noted, no foreign bodies/material noted, no muscle damage noted, no nerve damage noted, no tendon damage noted, no underlying fracture noted and no vascular damage noted     Contaminated: no   Treatment:    Area cleansed with:  Saline   Amount of cleaning:  Standard   Irrigation solution:  Sterile saline   Irrigation volume:  2000 ml   Irrigation method:  Pressure wash   Visualized foreign bodies/material removed: no   Skin repair:    Repair method:  Tissue adhesive Repair type:    Repair type:  Simple Post-procedure details:    Dressing:  Non-adherent dressing   Procedure completion:  Tolerated     Medications Ordered in ED Medications  Tdap (BOOSTRIX) injection 0.5 mL (0.5 mLs Intramuscular Given 01/22/22 0844)    ED Course/ Medical Decision Making/ A&P                           Medical Decision Making Risk Prescription drug management.   Scott Montoya is here with finger injury.  Normal vitals.  No fever.  Patient was using a mandolin and cut the skin off of the pad of his right thumb.  Overall he has skin avulsion to the pad of the right thumb.  No nailbed involvement.  Wound is overall fairly superficial.  Tourniquet was placed.  Wound was cleaned out thoroughly.  Dermabond was placed over the wound to help achieve hemostasis and create a barrier for healing.  No concern for fracture.  Neurovascular neuromuscular intact.  Tetanus shot updated.  Will prophylactically give antibiotics to help prevent infection.  Wound care instructions given.  Patient discharged and will follow-up with hand if needed.  This chart was dictated using voice recognition software.  Despite best efforts to proofread,  errors can occur which can change the  documentation meaning.         Final Clinical Impression(s) / ED Diagnoses Final diagnoses:  Skin avulsion    Rx / DC Orders ED Discharge Orders          Ordered    cephALEXin (KEFLEX) 500 MG capsule  2 times daily        01/22/22 [place], [place], DO 01/22/22 4098

## 2022-02-07 ENCOUNTER — Other Ambulatory Visit: Payer: Self-pay | Admitting: Physician Assistant

## 2022-02-07 DIAGNOSIS — I10 Essential (primary) hypertension: Secondary | ICD-10-CM

## 2022-03-12 ENCOUNTER — Other Ambulatory Visit: Payer: Self-pay | Admitting: Cardiology

## 2022-03-12 DIAGNOSIS — I4892 Unspecified atrial flutter: Secondary | ICD-10-CM

## 2022-03-14 MED ORDER — DILTIAZEM HCL ER COATED BEADS 240 MG PO CP24: 240.0000 mg | ORAL_CAPSULE | Freq: Every day | ORAL | 3 refills | Status: DC

## 2022-03-31 ENCOUNTER — Ambulatory Visit (INDEPENDENT_AMBULATORY_CARE_PROVIDER_SITE_OTHER): Payer: Medicare HMO

## 2022-03-31 VITALS — BP 120/82 | HR 74 | Temp 98.3°F | Wt 206.5 lb

## 2022-03-31 DIAGNOSIS — Z0000 Encounter for general adult medical examination without abnormal findings: Secondary | ICD-10-CM | POA: Diagnosis not present

## 2022-03-31 NOTE — Progress Notes (Addendum)
Subjective:   Scott Montoya is a 66 y.o. male who presents for Medicare Annual/Subsequent preventive examination.  Review of Systems     Cardiac Risk Factors include: advanced age (>90mn, >>59women);male gender;dyslipidemia;hypertension;smoking/ tobacco exposure     Objective:    Today's Vitals   03/31/22 0803 03/31/22 0822  BP: (!) 142/82 120/82  Pulse: 74   Temp: 98.3 F (36.8 C)   SpO2: 95%   Weight: 206 lb 8 oz (93.7 kg)    Body mass index is 27.24 kg/m.     03/31/2022    8:15 AM 01/22/2022    8:25 AM 09/04/2021   11:00 AM 03/18/2021    8:23 AM 06/24/2020    3:55 PM 06/19/2020    7:59 PM  Advanced Directives  Does Patient Have a Medical Advance Directive? Yes No No No No No  Type of AParamed[place]of [place] will       Does patient want to make changes to medical advance directive?    Yes (MAU/Ambulatory/Procedural Areas - Information given)    [place]? No - copy requested       Would patient like information on creating a medical advance directive?  No - Patient declined Yes (Inpatient - patient defers creating a medical advance directive at this time - Information given)  No - Guardian declined No - Patient declined    Current Medications (verified) Outpatient Encounter Medications as of 03/31/2022  Medication Sig   Boswellia-Glucosamine-Vit D (OSTEO BI-FLEX ONE PER DAY PO) Take 1 tablet by mouth in the morning.   cholecalciferol (VITAMIN D3) 25 MCG (1000 UNIT) tablet Take 1,000 Units by mouth in the morning.   Cyanocobalamin (VITAMIN B-12 PO) Take 1 tablet by mouth in the morning.   diltiazem (CARDIZEM CD) 240 MG 24 hr capsule Take 1 capsule (240 mg total) by mouth daily.   losartan (COZAAR) 100 MG tablet TAKE 1 TABLET DAILY   Multiple Vitamins-Minerals (PRESERVISION AREDS PO) Take 1 tablet by mouth in the morning.   Omega-3 Fatty Acids (FISH OIL) 1000 MG CAPS Take 1,000 mg by mouth in the morning.    Polyethyl Glycol-Propyl Glycol (LUBRICANT EYE DROPS) 0.4-0.3 % SOLN Place 1 drop into both eyes 3 (three) times daily as needed (dry/irritated eyes.).   Turmeric (QC TUMERIC COMPLEX PO) Take 1 capsule by mouth in the morning.   Zinc 50 MG CAPS Take 50 mg by mouth in the morning.   albuterol (VENTOLIN HFA) 108 (90 Base) MCG/ACT inhaler Inhale 1 puff into the lungs every 6 (six) hours as needed for wheezing or shortness of breath.   chlorthalidone (HYGROTON) 25 MG tablet Take 0.5 tablets (12.5 mg total) by mouth daily.   rosuvastatin (CRESTOR) 40 MG tablet Take 1 tablet (40 mg total) by mouth daily.   No facility-administered encounter medications on file as of 03/31/2022.    Allergies (verified) Codeine and Sulfa antibiotics   History: Past Medical History:  Diagnosis Date   [place]    Basal cell carcinoma 09/09/2020   sup-left upper back (CX35FU)- PCP did bx   Cataract    COPD (chronic obstructive pulmonary disease) (HCC)    Hypertension    OSA (obstructive sleep apnea)    Past Surgical History:  Procedure Laterality Date   A-FLUTTER ABLATION N/A 09/03/2021   Procedure: A-FLUTTER ABLATION;  Surgeon: TEvans Lance MD;  [facility];  Service: Cardiovascular;  Laterality: N/A;  CARPAL TUNNEL RELEASE     EYE SURGERY     foot heel repair Bilateral 2013   HEEL SPUR SURGERY     IR FL GUIDED LOC OF NEEDLE/CATH TIP FOR SPINAL INJECTION LT     ORBITAL RECONSTRUCTION  1994   ROTATOR CUFF REPAIR Right 2018   Family History  Problem Relation Age of Onset   Heart attack Father    Cancer Maternal Grandmother    Cancer Maternal Grandfather    Heart attack Paternal Grandfather    Social History   Socioeconomic History   Marital status: Divorced    Spouse name: Not on file   Number of children: 3   Years of education: Not on file   Highest education level: Not on file  Occupational History   Not on file  Tobacco Use   Smoking status: Former     Types: Cigarettes   Smokeless tobacco: Never  Vaping Use   Vaping Use: Some days   Devices: Juul  Substance and Sexual Activity   Alcohol use: Yes    Alcohol/week: 28.0 standard drinks of alcohol    Types: 14 Glasses of wine, 14 Cans of beer per week    Comment: 4 drinks per day   Drug use: Not Currently    Types: Marijuana   Sexual activity: Yes    Birth control/protection: None  Other Topics Concern   Not on file  Social History Narrative   Not on file   Social Determinants of Health   Financial Resource Strain: Low Risk  (03/31/2022)   Overall Financial Resource Strain (CARDIA)    Difficulty of Paying Living Expenses: Not hard at all  Food Insecurity: No Food Insecurity (03/31/2022)   Hunger Vital Sign    Worried About Running Out of Food in the Last Year: Never true    Ran Out of Food in the Last Year: Never true  Transportation Needs: No Transportation Needs (03/31/2022)   PRAPARE - Hydrologist (Medical): No    Lack of Transportation (Non-Medical): No  Physical Activity: Sufficiently Active (03/31/2022)   Exercise Vital Sign    Days of Exercise per Week: 4 days    Minutes of Exercise per Session: 50 min  Stress: No Stress Concern Present (03/31/2022)   [place]    Feeling of Stress : Only a little  Social Connections: Moderately Isolated (03/31/2022)   Social Connection and Isolation Panel [NHANES]    Frequency of Communication with Friends and Family: More than three times a week    Frequency of Social Gatherings with Friends and Family: More than three times a week    Attends Religious Services: 1 to 4 times per year    Active Member of [employer] or Organizations: No    Attends Music therapist: Never    Marital Status: Divorced    Tobacco Counseling Counseling given: Not Answered   Clinical Intake:  Pre-visit preparation completed: Yes  Pain : No/denies  pain     BMI - recorded: 27.24 Nutritional Status: BMI 25 -29 Overweight Nutritional Risks: None Diabetes: No  How often do you need to have someone help you when you read instructions, pamphlets, or other written materials from your doctor or pharmacy?: 1 - Never  Diabetic?no  Interpreter Needed?: No  Information entered by :: Charlott Rakes, LPN   Activities of Daily Living    03/31/2022    8:16 AM 09/04/2021  11:00 AM  In your present state of health, do you have any difficulty performing the following activities:  Hearing? 0   Vision? 0   Difficulty concentrating or making decisions? 0   Walking or climbing stairs? 0   Dressing or bathing? 0   Doing errands, shopping? 0 0  Preparing Food and eating ? N   Using the Toilet? N   In the past six months, have you accidently leaked urine? N   Do you have problems with loss of bowel control? N   Managing your Medications? N   Managing your Finances? N   Housekeeping or managing your Housekeeping? N     Patient Care Team: Allwardt, Randa Evens, PA-C as PCP - General (Physician Assistant) Lelon Perla, MD as PCP - Cardiology (Cardiology) Evans Lance, MD as PCP - Electrophysiology (Cardiology) Lavonna Monarch, MD (Inactive) as Consulting Physician (Dermatology)  Indicate any recent Medical Services you may have received from other than [facility] providers in the past year (date may be approximate).     Assessment:   This is a routine wellness examination for Scott Montoya.  Hearing/Vision screen Hearing Screening - Comments:: Pt denies any hearing issues  Vision Screening - Comments:: Pt follows up with eye med for annul eye exams   Dietary issues and exercise activities discussed: Current Exercise Habits: Home exercise routine, Type of exercise: Other - see comments (ater aerobics), Time (Minutes): 45, Frequency (Times/Week): 4, Weekly Exercise (Minutes/Week): 180   Goals Addressed             This Visit's Progress     Patient Stated       Continue to lose weight        Depression Screen    03/31/2022    8:12 AM 03/18/2021    8:22 AM 12/30/2019   10:34 AM  PHQ 2/9 Scores  PHQ - 2 Score 2 0 0  PHQ- 9 Score 3      Fall Risk    03/31/2022    8:15 AM 03/18/2021    8:24 AM 12/30/2019   10:32 AM  Fall Risk   Falls in the past year? 0 1 0  Number falls in past yr: 0 1 0  Injury with Fall? 0 1 0  Comment  left eye suture   Risk for fall due to : No Fall Risks;Impaired vision Impaired vision   Follow up Falls prevention discussed Falls prevention discussed     FALL RISK PREVENTION PERTAINING TO THE HOME:  Any stairs in or around the home? Yes  If so, are there any without handrails? No  Home free of loose throw rugs in walkways, pet beds, electrical cords, etc? Yes  Adequate lighting in your home to reduce risk of falls? Yes   ASSISTIVE DEVICES UTILIZED TO PREVENT FALLS:  Life alert? No  Use of a cane, walker or w/c? No  Grab bars in the bathroom? No  Shower chair or bench in shower? No  Elevated toilet seat or a handicapped toilet? No   TIMED UP AND GO:  Was the test performed? Yes .  Length of time to ambulate 10 feet: 10 sec.   Gait steady and fast without use of assistive device  Cognitive Function:        03/31/2022    8:17 AM 03/18/2021    8:28 AM  6CIT Screen  What Year? 0 points 0 points  What month? 0 points 0 points  What time? 0 points 0 points  Count back from 20 0 points 0 points  Months in reverse 0 points 0 points  Repeat phrase 0 points 0 points  Total Score 0 points 0 points    Immunizations Immunization History  Administered Date(s) Administered   Influenza,inj,Quad PF,6+ Mos 04/21/2020   Influenza-Unspecified 03/24/2021   Moderna Sars-Covid-2 Vaccination 10/24/2019, 11/25/2019   PFIZER(Purple Top)SARS-COV-2 Vaccination 07/03/2020, 12/10/2020   PNEUMOCOCCAL CONJUGATE-20 12/10/2020   Pneumococcal Conjugate-13 10/01/2020   Tdap 02/26/2012, 01/22/2022    Zoster Recombinat (Shingrix) 03/24/2021, 06/24/2021    TDAP status: Up to date  Flu Vaccine status: Up to date  Pneumococcal vaccine status: Up to date  Covid-19 vaccine status: Completed vaccines  Qualifies for Shingles Vaccine? Yes   Zostavax completed Yes   Shingrix Completed?: Yes  Screening Tests Health Maintenance  Topic Date Due   COVID-19 Vaccine (5 - Mixed Product risk series) 02/04/2021   INFLUENZA VACCINE  02/15/2022   COLONOSCOPY (Pts 45-88yr Insurance coverage will need to be confirmed)  11/13/2030   TETANUS/TDAP  01/23/2032   Pneumonia Vaccine 66 Years old  Completed   Hepatitis C Screening  Completed   Zoster Vaccines- Shingrix  Completed   HPV VACCINES  Aged Out    Health Maintenance  Health Maintenance Due  Topic Date Due   COVID-19 Vaccine (5 - Mixed Product risk series) 02/04/2021   INFLUENZA VACCINE  02/15/2022    Colorectal cancer screening: Type of screening: Colonoscopy. Completed 11/12/20. Repeat every 10 years   Additional Screening:  Hepatitis C Screening:  Completed 12/30/19  Vision Screening: Recommended annual ophthalmology exams for early detection of glaucoma and other disorders of the eye. Is the patient up to date with their annual eye exam?  Yes  Who is the provider or what is the name of the office in which the patient attends annual eye exams? [facility] If pt is not established with a provider, would they like to be referred to a provider to establish care? No .   Dental Screening: Recommended annual dental exams for proper oral hygiene  Community Resource Referral / Chronic Care Management: CRR required this visit?  No   CCM required this visit?  No      Plan:     I have personally reviewed and noted the following in the patient's chart:   Medical and social history Use of alcohol, tobacco or illicit drugs  Current medications and supplements including opioid prescriptions. Patient is not currently taking opioid  prescriptions. Functional ability and status Nutritional status Physical activity Advanced directives List of other physicians Hospitalizations, surgeries, and ER visits in previous 12 months Vitals Screenings to include cognitive, depression, and falls Referrals and appointments  In addition, I have reviewed and discussed with patient certain preventive protocols, quality metrics, and best practice recommendations. A written personalized care plan for preventive services as well as general preventive health recommendations were provided to patient.     TWillette Brace LPN   [id number]  Nurse Notes: none

## 2022-03-31 NOTE — Patient Instructions (Signed)
Scott Montoya , Thank you for taking time to come for your Medicare Wellness Visit. I appreciate your ongoing commitment to your health goals. Please review the following plan we discussed and let me know if I can assist you in the future.   Screening recommendations/referrals: Colonoscopy: done 11/12/20 repeat every 10 years  Recommended yearly ophthalmology/optometry visit for glaucoma screening and checkup Recommended yearly dental visit for hygiene and checkup  Vaccinations: Influenza vaccine: done 03/24/21 repeat every year  Pneumococcal vaccine: Up to date Tdap vaccine: done 01/22/22 repeat every 10 years  Shingles vaccine: completed 9/7, 06/24/21   Covid-19: completed 4/8, 5/10, 07/03/20 & 12/10/20  Advanced directives: Please bring a copy of your health care power of attorney and living will to the office at your convenience.  Conditions/risks identified: continue to lose weight   Next appointment: Follow up in one year for your annual wellness visit.   Preventive Care 6 Years and Older, Male Preventive care refers to lifestyle choices and visits with your health care provider that can promote health and wellness. What does preventive care include? A yearly physical exam. This is also called an annual well check. Dental exams once or twice a year. Routine eye exams. Ask your health care provider how often you should have your eyes checked. Personal lifestyle choices, including: Daily care of your teeth and gums. Regular physical activity. Eating a healthy diet. Avoiding tobacco and drug use. Limiting alcohol use. Practicing safe sex. Taking low doses of aspirin every day. Taking vitamin and mineral supplements as recommended by your health care provider. What happens during an annual well check? The services and screenings done by your health care provider during your annual well check will depend on your age, overall health, lifestyle risk factors, and family history of  disease. Counseling  Your health care provider may ask you questions about your: Alcohol use. Tobacco use. Drug use. Emotional well-being. Home and relationship well-being. Sexual activity. Eating habits. History of falls. Memory and ability to understand (cognition). Work and work Statistician. Screening  You may have the following tests or measurements: Height, weight, and BMI. Blood pressure. Lipid and cholesterol levels. These may be checked every 5 years, or more frequently if you are over 32 years old. Skin check. Lung cancer screening. You may have this screening every year starting at age 43 if you have a 30-pack-year history of smoking and currently smoke or have quit within the past 15 years. Fecal occult blood test (FOBT) of the stool. You may have this test every year starting at age 10. Flexible sigmoidoscopy or colonoscopy. You may have a sigmoidoscopy every 5 years or a colonoscopy every 10 years starting at age 3. Prostate cancer screening. Recommendations will vary depending on your family history and other risks. Hepatitis C blood test. Hepatitis B blood test. Sexually transmitted disease (STD) testing. Diabetes screening. This is done by checking your blood sugar (glucose) after you have not eaten for a while (fasting). You may have this done every 1-3 years. Abdominal aortic aneurysm (AAA) screening. You may need this if you are a current or former smoker. Osteoporosis. You may be screened starting at age 57 if you are at high risk. Talk with your health care provider about your test results, treatment options, and if necessary, the need for more tests. Vaccines  Your health care provider may recommend certain vaccines, such as: Influenza vaccine. This is recommended every year. Tetanus, diphtheria, and acellular pertussis (Tdap, Td) vaccine. You may need a  Td booster every 10 years. Zoster vaccine. You may need this after age 24. Pneumococcal 13-valent  conjugate (PCV13) vaccine. One dose is recommended after age 72. Pneumococcal polysaccharide (PPSV23) vaccine. One dose is recommended after age 55. Talk to your health care provider about which screenings and vaccines you need and how often you need them. This information is not intended to replace advice given to you by your health care provider. Make sure you discuss any questions you have with your health care provider. Document Released: 07/31/2015 Document Revised: 03/23/2016 Document Reviewed: 05/05/2015 Elsevier Interactive Patient Education  2017 [place] Prevention in the Home Falls can cause injuries. They can happen to people of all ages. There are many things you can do to make your home safe and to help prevent falls. What can I do on the outside of my home? Regularly fix the edges of walkways and driveways and fix any cracks. Remove anything that might make you trip as you walk through a door, such as a raised step or threshold. Trim any bushes or trees on the path to your home. Use bright outdoor lighting. Clear any walking paths of anything that might make someone trip, such as rocks or tools. Regularly check to see if handrails are loose or broken. Make sure that both sides of any steps have handrails. Any raised decks and porches should have guardrails on the edges. Have any leaves, snow, or ice cleared regularly. Use sand or salt on walking paths during winter. Clean up any spills in your garage right away. This includes oil or grease spills. What can I do in the bathroom? Use night lights. Install grab bars by the toilet and in the tub and shower. Do not use towel bars as grab bars. Use non-skid mats or decals in the tub or shower. If you need to sit down in the shower, use a plastic, non-slip stool. Keep the floor dry. Clean up any water that spills on the floor as soon as it happens. Remove soap buildup in the tub or shower regularly. Attach bath mats  securely with double-sided non-slip rug tape. Do not have throw rugs and other things on the floor that can make you trip. What can I do in the bedroom? Use night lights. Make sure that you have a light by your bed that is easy to reach. Do not use any sheets or blankets that are too big for your bed. They should not hang down onto the floor. Have a firm chair that has side arms. You can use this for support while you get dressed. Do not have throw rugs and other things on the floor that can make you trip. What can I do in the kitchen? Clean up any spills right away. Avoid walking on wet floors. Keep items that you use a lot in easy-to-reach places. If you need to reach something above you, use a strong step stool that has a grab bar. Keep electrical cords out of the way. Do not use floor polish or wax that makes floors slippery. If you must use wax, use non-skid floor wax. Do not have throw rugs and other things on the floor that can make you trip. What can I do with my stairs? Do not leave any items on the stairs. Make sure that there are handrails on both sides of the stairs and use them. Fix handrails that are broken or loose. Make sure that handrails are as long as the stairways. Check any  carpeting to make sure that it is firmly attached to the stairs. Fix any carpet that is loose or worn. Avoid having throw rugs at the top or bottom of the stairs. If you do have throw rugs, attach them to the floor with carpet tape. Make sure that you have a light switch at the top of the stairs and the bottom of the stairs. If you do not have them, ask someone to add them for you. What else can I do to help prevent falls? Wear shoes that: Do not have high heels. Have rubber bottoms. Are comfortable and fit you well. Are closed at the toe. Do not wear sandals. If you use a stepladder: Make sure that it is fully opened. Do not climb a closed stepladder. Make sure that both sides of the stepladder  are locked into place. Ask someone to hold it for you, if possible. Clearly mark and make sure that you can see: Any grab bars or handrails. First and last steps. Where the edge of each step is. Use tools that help you move around (mobility aids) if they are needed. These include: Canes. Walkers. Scooters. Crutches. Turn on the lights when you go into a dark area. Replace any light bulbs as soon as they burn out. Set up your furniture so you have a clear path. Avoid moving your furniture around. If any of your floors are uneven, fix them. If there are any pets around you, be aware of where they are. Review your medicines with your doctor. Some medicines can make you feel dizzy. This can increase your chance of falling. Ask your doctor what other things that you can do to help prevent falls. This information is not intended to replace advice given to you by your health care provider. Make sure you discuss any questions you have with your health care provider. Document Released: 04/30/2009 Document Revised: 12/10/2015 Document Reviewed: 08/08/2014 Elsevier Interactive Patient Education  2017 [employer].

## 2022-04-07 ENCOUNTER — Other Ambulatory Visit: Payer: Self-pay | Admitting: Physician Assistant

## 2022-04-07 MED ORDER — ALBUTEROL SULFATE HFA 108 (90 BASE) MCG/ACT IN AERS: 1.0000 | INHALATION_SPRAY | Freq: Four times a day (QID) | RESPIRATORY_TRACT | 2 refills | Status: DC | PRN

## 2022-04-11 ENCOUNTER — Encounter: Payer: Self-pay | Admitting: *Deleted

## 2022-04-20 ENCOUNTER — Telehealth: Payer: Self-pay | Admitting: Physician Assistant

## 2022-04-20 NOTE — Telephone Encounter (Signed)
Patient states: -Has been having congestion and phlegm for about a week. Not sure if it could RSV or not   Patient requests: -Advice on if he should get the RSV vaccine, Covid vaccine, and flu vaccine while experiencing symptoms - Advice on if he needs the RSV vaccine at all  - Should he space out when he gets each vaccine?  I offered pt and OV but he declined.  Please advise.

## 2022-04-21 NOTE — Telephone Encounter (Signed)
Please see pt call msg and advise 

## 2022-04-21 NOTE — Telephone Encounter (Signed)
Returned patient call and advised of provider recommendations. Patient advised he would wait until feeling a little better before proceeding with any vaccines. Patient was also advised to get print out of all vaccines that was administered so we can update in his chart here. Pt verbalized understanding.

## 2022-05-04 ENCOUNTER — Ambulatory Visit: Payer: Medicare HMO | Admitting: Dermatology

## 2022-05-20 DIAGNOSIS — Z961 Presence of intraocular lens: Secondary | ICD-10-CM | POA: Diagnosis not present

## 2022-05-20 DIAGNOSIS — H5212 Myopia, left eye: Secondary | ICD-10-CM | POA: Diagnosis not present

## 2022-05-20 DIAGNOSIS — H43812 Vitreous degeneration, left eye: Secondary | ICD-10-CM | POA: Diagnosis not present

## 2022-05-20 DIAGNOSIS — H35033 Hypertensive retinopathy, bilateral: Secondary | ICD-10-CM | POA: Diagnosis not present

## 2022-05-20 DIAGNOSIS — L820 Inflamed seborrheic keratosis: Secondary | ICD-10-CM | POA: Diagnosis not present

## 2022-05-20 DIAGNOSIS — D225 Melanocytic nevi of trunk: Secondary | ICD-10-CM | POA: Diagnosis not present

## 2022-05-20 DIAGNOSIS — H353130 Nonexudative age-related macular degeneration, bilateral, stage unspecified: Secondary | ICD-10-CM | POA: Diagnosis not present

## 2022-05-20 DIAGNOSIS — D485 Neoplasm of uncertain behavior of skin: Secondary | ICD-10-CM | POA: Diagnosis not present

## 2022-05-20 DIAGNOSIS — H5201 Hypermetropia, right eye: Secondary | ICD-10-CM | POA: Diagnosis not present

## 2022-05-20 DIAGNOSIS — H04123 Dry eye syndrome of bilateral lacrimal glands: Secondary | ICD-10-CM | POA: Diagnosis not present

## 2022-05-20 DIAGNOSIS — H52221 Regular astigmatism, right eye: Secondary | ICD-10-CM | POA: Diagnosis not present

## 2022-05-20 DIAGNOSIS — L821 Other seborrheic keratosis: Secondary | ICD-10-CM | POA: Diagnosis not present

## 2022-06-02 ENCOUNTER — Other Ambulatory Visit: Payer: Self-pay | Admitting: Cardiology

## 2022-06-02 DIAGNOSIS — I2510 Atherosclerotic heart disease of native coronary artery without angina pectoris: Secondary | ICD-10-CM

## 2022-06-02 DIAGNOSIS — I10 Essential (primary) hypertension: Secondary | ICD-10-CM

## 2022-06-13 ENCOUNTER — Encounter: Payer: Self-pay | Admitting: Physician Assistant

## 2022-06-13 ENCOUNTER — Telehealth: Payer: Self-pay | Admitting: Physician Assistant

## 2022-06-13 NOTE — Telephone Encounter (Signed)
Immunizations has been placed in patients chart from records in [place]

## 2022-06-13 NOTE — Telephone Encounter (Signed)
Patient states he wanted to make sure it's added to his chart that he received his COVID and flu vaccine on 11/17. I informed patient he could send paper proof if needed via mychart. Pt states he will do this so there can be evidence in his chart of this.

## 2022-07-20 ENCOUNTER — Ambulatory Visit (INDEPENDENT_AMBULATORY_CARE_PROVIDER_SITE_OTHER): Payer: Medicare HMO | Admitting: Physician Assistant

## 2022-07-20 VITALS — BP 122/72 | HR 67 | Temp 98.4°F | Ht 73.0 in | Wt 221.6 lb

## 2022-07-20 DIAGNOSIS — R051 Acute cough: Secondary | ICD-10-CM | POA: Diagnosis not present

## 2022-07-20 DIAGNOSIS — J3089 Other allergic rhinitis: Secondary | ICD-10-CM | POA: Diagnosis not present

## 2022-07-20 MED ORDER — FLUTICASONE PROPIONATE 50 MCG/ACT NA SUSP: 2.0000 | Freq: Every day | NASAL | 3 refills | Status: DC

## 2022-07-20 MED ORDER — BENZONATATE 100 MG PO CAPS: 100.0000 mg | ORAL_CAPSULE | Freq: Three times a day (TID) | ORAL | 0 refills | Status: DC | PRN

## 2022-07-20 MED ORDER — LORATADINE 10 MG PO TABS: 10.0000 mg | ORAL_TABLET | Freq: Every day | ORAL | 1 refills | Status: DC

## 2022-07-20 MED ORDER — PREDNISONE 20 MG PO TABS: 40.0000 mg | ORAL_TABLET | Freq: Every day | ORAL | 0 refills | Status: AC

## 2022-07-20 NOTE — Progress Notes (Signed)
Subjective:    Patient ID: Scott Montoya, male    DOB: 1956/07/10, 67 y.o.   MRN: [id number]  Chief Complaint  Patient presents with   Cough    C/o of runny nose, cough, and congestion for th past month, or since Thanksgiving per pt. Pt has been vaccinated for flu and Covid; hasn't done RSV vaccine; pt tried [employer] but no relief; pt not taking allergy meds;     HPI Patient is in today for sick symptoms.  Chief complaint: Runny, congested nose Symptom onset: 4 weeks Pertinent positives: Coughing Pertinent negatives: Fever, chills, body aches, SOB, HA, ST Treatments tried: Nettie pot once daily the last few days  Vaccine status: Flu & COVID vaccines are UTD    Past Medical History:  Diagnosis Date   Atrial flutter ([place])    Basal cell carcinoma 09/09/2020   sup-left upper back (CX35FU)- PCP did bx   Cataract    COPD (chronic obstructive pulmonary disease) ([place])    Hypertension    OSA (obstructive sleep apnea)     Past Surgical History:  Procedure Laterality Date   A-FLUTTER ABLATION N/A 09/03/2021   Procedure: A-FLUTTER ABLATION;  Surgeon: Evans Lance, MD;  Location: [facility];  Service: Cardiovascular;  Laterality: N/A;   CARPAL TUNNEL RELEASE     EYE SURGERY     foot heel repair Bilateral 2013   HEEL SPUR SURGERY     IR FL GUIDED LOC OF NEEDLE/CATH TIP FOR SPINAL INJECTION LT     ORBITAL RECONSTRUCTION  1994   ROTATOR CUFF REPAIR Right 2018    Family History  Problem Relation Age of Onset   Heart attack Father    Cancer Maternal Grandmother    Cancer Maternal Grandfather    Heart attack Paternal Grandfather     Social History   Tobacco Use   Smoking status: Former    Types: Cigarettes   Smokeless tobacco: Never  Vaping Use   Vaping Use: Some days   Devices: Juul  Substance Use Topics   Alcohol use: Yes    Alcohol/week: 28.0 standard drinks of alcohol    Types: 14 Glasses of wine, 14 Cans of beer per week    Comment: 4 drinks per day    Drug use: Not Currently    Types: Marijuana     Allergies  Allergen Reactions   Codeine Other (See Comments)     dry mouth   Sulfa Antibiotics Nausea And Vomiting    Childhood reaction.    Review of Systems NEGATIVE UNLESS OTHERWISE INDICATED IN HPI      Objective:     BP 122/72 (BP Location: Left Arm)   Pulse 67   Temp 98.4 F (36.9 C) (Temporal)   Ht '6\' 1"'$  (1.854 m)   Wt 221 lb 9.6 oz (100.5 kg)   SpO2 98%   BMI 29.24 kg/m   Wt Readings from Last 3 Encounters:  07/20/22 221 lb 9.6 oz (100.5 kg)  03/31/22 206 lb 8 oz (93.7 kg)  10/08/21 210 lb (95.3 kg)    BP Readings from Last 3 Encounters:  07/20/22 122/72  03/31/22 120/82  01/22/22 (!) 140/94     Physical Exam Vitals and nursing note reviewed.  Constitutional:      General: He is not in acute distress.    Appearance: Normal appearance. He is not ill-appearing.  HENT:     Head: Normocephalic.     Right Ear: Tympanic membrane, ear canal and  external ear normal.     Left Ear: Tympanic membrane, ear canal and external ear normal.     Nose: Congestion present.     Mouth/Throat:     Mouth: Mucous membranes are moist.     Pharynx: No oropharyngeal exudate or posterior oropharyngeal erythema.  Eyes:     Extraocular Movements: Extraocular movements intact.     Conjunctiva/sclera: Conjunctivae normal.     Pupils: Pupils are equal, round, and reactive to light.  Cardiovascular:     Rate and Rhythm: Normal rate and regular rhythm.     Pulses: Normal pulses.     Heart sounds: Normal heart sounds. No murmur heard. Pulmonary:     Effort: Pulmonary effort is normal. No respiratory distress.     Breath sounds: Wheezing (faint, scattered) present.  Musculoskeletal:     Cervical back: Normal range of motion.  Skin:    General: Skin is warm.  Neurological:     Mental Status: He is alert and oriented to person, place, and time.  Psychiatric:        Mood and Affect: Mood normal.        Behavior: Behavior  normal.        Assessment & Plan:  Non-seasonal allergic rhinitis, unspecified trigger  Acute cough  Other orders -     predniSONE; Take 2 tablets (40 mg total) by mouth daily with breakfast for 5 days.  Dispense: 10 tablet; Refill: 0 -     Fluticasone Propionate; Place 2 sprays into both nostrils daily.  Dispense: 16 g; Refill: 3 -     Benzonatate; Take 1 capsule (100 mg total) by mouth 3 (three) times daily as needed for cough.  Dispense: 20 capsule; Refill: 0 -     Loratadine; Take 1 tablet (10 mg total) by mouth daily.  Dispense: 30 tablet; Refill: 1  History and physical c/w allergies vs URI with slight wheezing. Will start on prednisone burst as directed, Flonase, tessalon perles, Claritin 10 mg daily. Nasal saline / netti pot at home. If worse or no improvement, pt to call back next week, may consider antibiotic therapy at that time.     Return if symptoms worsen or fail to improve.  This note was prepared with assistance of Systems analyst. Occasional wrong-word or sound-a-like substitutions may have occurred due to the inherent limitations of voice recognition software.     Amirrah Quigley M Eloise Mula, PA-C

## 2022-08-03 ENCOUNTER — Encounter: Payer: Self-pay | Admitting: Physician Assistant

## 2022-08-03 ENCOUNTER — Ambulatory Visit (INDEPENDENT_AMBULATORY_CARE_PROVIDER_SITE_OTHER): Payer: Medicare HMO | Admitting: Physician Assistant

## 2022-08-03 VITALS — BP 124/80 | HR 75 | Temp 98.0°F | Ht 73.0 in | Wt 222.0 lb

## 2022-08-03 DIAGNOSIS — R051 Acute cough: Secondary | ICD-10-CM

## 2022-08-03 MED ORDER — AMOXICILLIN-POT CLAVULANATE 875-125 MG PO TABS: 1.0000 | ORAL_TABLET | Freq: Two times a day (BID) | ORAL | 0 refills | Status: DC

## 2022-08-03 NOTE — Progress Notes (Signed)
Scott Montoya is a 67 y.o. male here for a follow up of a pre-existing problem.  History of Present Illness:   Chief Complaint  Patient presents with   Cough    Pt still c/o cough and expectorating white sputum, rhinorrhea.    HPI  Nasal congestion Pt was seen by PA Scott Montoya on 07/20/22 for cough, nasal congestion, and rhinorrhea.  He was started on Prednisone '40mg'$  daily, Claritin '10mg'$  daily, Flonase nasal spray, and Tessalon Perles prn.  Today, he reports persistent nasal congestion, increased wheezing, rhinorrhea, and productive cough, typically with white sputum. He tried the Flonase nasal spray but stopped after having a nosebleed. He rarely uses his Albuterol inhaler.  He denies chest pain or shortness of breath.  He does have history of COPD.   Past Medical History:  Diagnosis Date   Atrial flutter ([place])    Basal cell carcinoma 09/09/2020   sup-left upper back (CX35FU)- PCP did bx   Cataract    COPD (chronic obstructive pulmonary disease) ([facility])    Hypertension    OSA (obstructive sleep apnea)      Social History   Tobacco Use   Smoking status: Former    Types: Cigarettes   Smokeless tobacco: Never  Vaping Use   Vaping Use: Some days   Devices: Juul  Substance Use Topics   Alcohol use: Yes    Alcohol/week: 28.0 standard drinks of alcohol    Types: 14 Glasses of wine, 14 Cans of beer per week    Comment: 4 drinks per day   Drug use: Not Currently    Types: Marijuana    Past Surgical History:  Procedure Laterality Date   A-FLUTTER ABLATION N/A 09/03/2021   Procedure: A-FLUTTER ABLATION;  Surgeon: Evans Lance, MD;  Location: [facility];  Service: Cardiovascular;  Laterality: N/A;   CARPAL TUNNEL RELEASE     EYE SURGERY     foot heel repair Bilateral 2013   HEEL SPUR SURGERY     IR FL GUIDED LOC OF NEEDLE/CATH TIP FOR SPINAL INJECTION LT     ORBITAL RECONSTRUCTION  1994   ROTATOR CUFF REPAIR Right 2018    Family History  Problem  Relation Age of Onset   Heart attack Father    Cancer Maternal Grandmother    Cancer Maternal Grandfather    Heart attack Paternal Grandfather     Allergies  Allergen Reactions   Codeine Other (See Comments)     dry mouth   Sulfa Antibiotics Nausea And Vomiting    Childhood reaction.    Current Medications:   Current Outpatient Medications:    amoxicillin-clavulanate (AUGMENTIN) 875-125 MG tablet, Take 1 tablet by mouth 2 (two) times daily., Disp: 14 tablet, Rfl: 0   benzonatate (TESSALON PERLES) 100 MG capsule, Take 1 capsule (100 mg total) by mouth 3 (three) times daily as needed for cough., Disp: 20 capsule, Rfl: 0   Boswellia-Glucosamine-Vit D (OSTEO BI-FLEX ONE PER DAY PO), Take 1 tablet by mouth in the morning., Disp: , Rfl:    chlorthalidone (HYGROTON) 25 MG tablet, TAKE 1/2 TABLET BY MOUTH EVERY DAY, Disp: 45 tablet, Rfl: 3   cholecalciferol (VITAMIN D3) 25 MCG (1000 UNIT) tablet, Take 1,000 Units by mouth in the morning., Disp: , Rfl:    Cyanocobalamin (VITAMIN B-12 PO), Take 1 tablet by mouth in the morning., Disp: , Rfl:    loratadine (CLARITIN) 10 MG tablet, Take 1 tablet (10 mg total) by mouth daily., Disp: 30 tablet,  Rfl: 1   losartan (COZAAR) 100 MG tablet, TAKE 1 TABLET DAILY, Disp: 90 tablet, Rfl: 2   Multiple Vitamins-Minerals (PRESERVISION AREDS PO), Take 1 tablet by mouth in the morning., Disp: , Rfl:    Omega-3 Fatty Acids (FISH OIL) 1000 MG CAPS, Take 1,000 mg by mouth in the morning., Disp: , Rfl:    Polyethyl Glycol-Propyl Glycol (LUBRICANT EYE DROPS) 0.4-0.3 % SOLN, Place 1 drop into both eyes 3 (three) times daily as needed (dry/irritated eyes.)., Disp: , Rfl:    rosuvastatin (CRESTOR) 40 MG tablet, TAKE 1 TABLET BY MOUTH EVERY DAY, Disp: 90 tablet, Rfl: 3   Turmeric (QC TUMERIC COMPLEX PO), Take 1 capsule by mouth in the morning., Disp: , Rfl:    Zinc 50 MG CAPS, Take 50 mg by mouth in the morning., Disp: , Rfl:    albuterol (VENTOLIN HFA) 108 (90 Base)  MCG/ACT inhaler, Inhale 1 puff into the lungs every 6 (six) hours as needed for wheezing or shortness of breath., Disp: 8 g, Rfl: 2   diltiazem (CARDIZEM CD) 240 MG 24 hr capsule, Take 1 capsule (240 mg total) by mouth daily., Disp: 90 capsule, Rfl: 3   fluticasone (FLONASE) 50 MCG/ACT nasal spray, Place 2 sprays into both nostrils daily. (Patient not taking: Reported on 08/03/2022), Disp: 16 g, Rfl: 3   Review of Systems:   Review of Systems  Constitutional:  Negative for fever.  HENT:  Positive for congestion. Negative for sinus pain and sore throat.        + rhinorrhea  Respiratory:  Positive for cough, sputum production and wheezing. Negative for shortness of breath.   Cardiovascular:  Negative for chest pain and palpitations.  Gastrointestinal:  Negative for blood in stool, constipation, diarrhea, nausea and vomiting.  Genitourinary:  Negative for dysuria, frequency and hematuria.  Musculoskeletal:  Negative for joint pain and myalgias.    Vitals:   Vitals:   08/03/22 1542  BP: 124/80  Pulse: 75  Temp: 98 F (36.7 C)  TempSrc: Temporal  SpO2: 95%  Weight: 222 lb (100.7 kg)  Height: '6\' 1"'$  (1.854 m)     Body mass index is 29.29 kg/m.  Physical Exam:   Physical Exam Vitals and nursing note reviewed.  Constitutional:      General: He is not in acute distress.    Appearance: He is well-developed. He is not ill-appearing or toxic-appearing.  HENT:     Head: Normocephalic and atraumatic.     Right Ear: Tympanic membrane, ear canal and external ear normal. Tympanic membrane is not erythematous, retracted or bulging.     Left Ear: Tympanic membrane, ear canal and external ear normal. Tympanic membrane is not erythematous, retracted or bulging.     Nose: Nose normal.     Right Sinus: No maxillary sinus tenderness or frontal sinus tenderness.     Left Sinus: No maxillary sinus tenderness or frontal sinus tenderness.     Mouth/Throat:     Pharynx: Uvula midline. No posterior  oropharyngeal erythema.  Eyes:     General: Lids are normal.     Conjunctiva/sclera: Conjunctivae normal.  Neck:     Trachea: Trachea normal.  Cardiovascular:     Rate and Rhythm: Normal rate and regular rhythm.     Heart sounds: Normal heart sounds, S1 normal and S2 normal.  Pulmonary:     Effort: Pulmonary effort is normal.     Breath sounds: Rhonchi present. No decreased breath sounds, wheezing or rales.  Lymphadenopathy:  Cervical: No cervical adenopathy.  Skin:    General: Skin is warm and dry.  Neurological:     Mental Status: He is alert.  Psychiatric:        Speech: Speech normal.        Behavior: Behavior normal. Behavior is cooperative.     Assessment and Plan:   Acute cough No red flags on exam.  Will initiate Augmentin per orders. Discussed taking medications as prescribed. Reviewed return precautions including worsening fever, SOB, worsening cough or other concerns. Push fluids and rest. I recommend that patient follow-up if symptoms worsen or persist despite treatment x 7-10 days, sooner if needed.   I,Alexis Herring,acting as a Education administrator for [employer], PA.,have documented all relevant documentation on the behalf of Inda Coke, PA,as directed by  Inda Coke, PA while in the presence of Inda Coke, [place].  I, Inda Coke, [place], have reviewed all documentation for this visit. The documentation on 08/03/22 for the exam, diagnosis, procedures, and orders are all accurate and complete.   Inda Coke, PA-C

## 2022-08-09 ENCOUNTER — Telehealth: Payer: Self-pay | Admitting: Physician Assistant

## 2022-08-09 NOTE — Telephone Encounter (Signed)
Patient states his symptoms are not improving after treatment. Patient states he has had 2 OV for the same thing (07/20/22 & 08/03/22).  Patient states he has 1 day of antibiotics left to take on 08/10/22 but feels like he may need a 10 day course of antibiotic.  Patient requests to be called at ph# [phone] to be advised.

## 2022-08-10 NOTE — Telephone Encounter (Signed)
Pt already scheduled to come in jan 30th

## 2022-08-10 NOTE — Telephone Encounter (Signed)
Please see message. °

## 2022-08-11 ENCOUNTER — Other Ambulatory Visit: Payer: Self-pay | Admitting: Physician Assistant

## 2022-08-16 ENCOUNTER — Ambulatory Visit: Payer: Medicare HMO | Admitting: Physician Assistant

## 2022-10-14 IMAGING — DX DG CHEST 1V PORT
2 series · 2 of 2 positions shown · non-contrast
Comparison: None.

CLINICAL DATA: Atrial fibrillation.

EXAM:
PORTABLE CHEST 1 VIEW

[chest ap (1 of 2)]
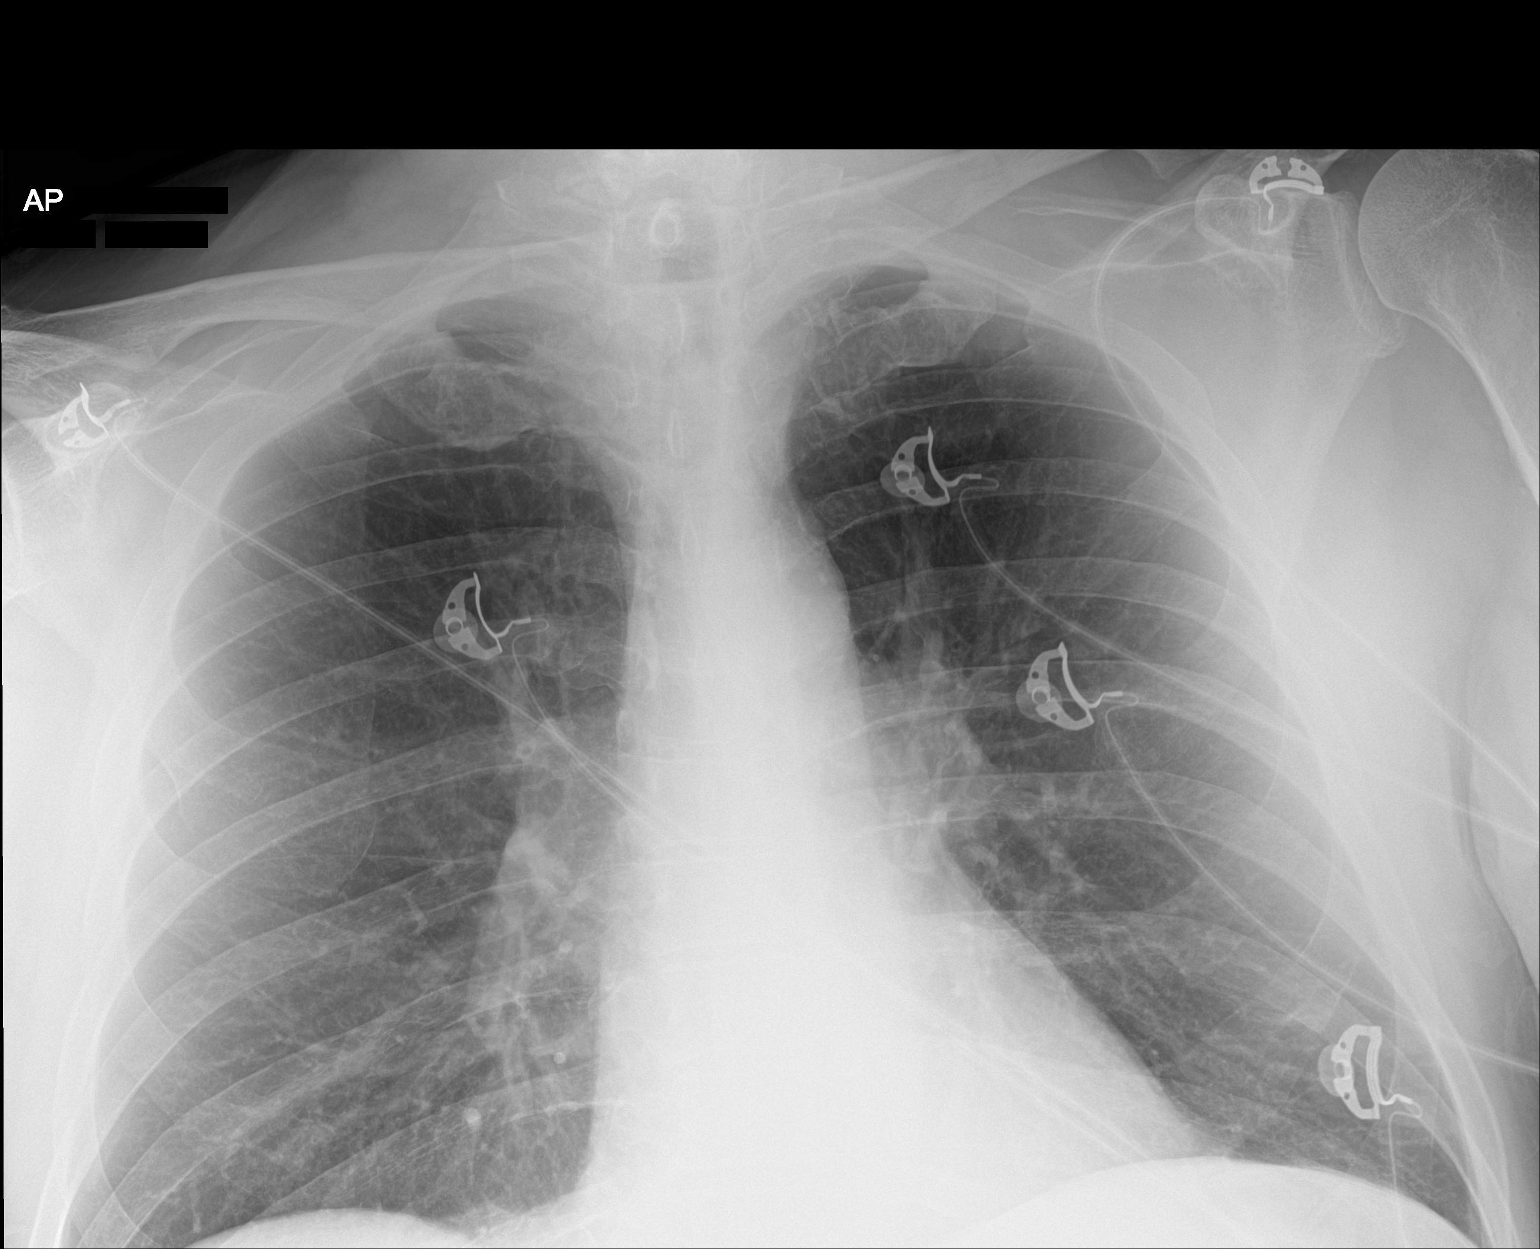

[chest ap (2 of 2)]
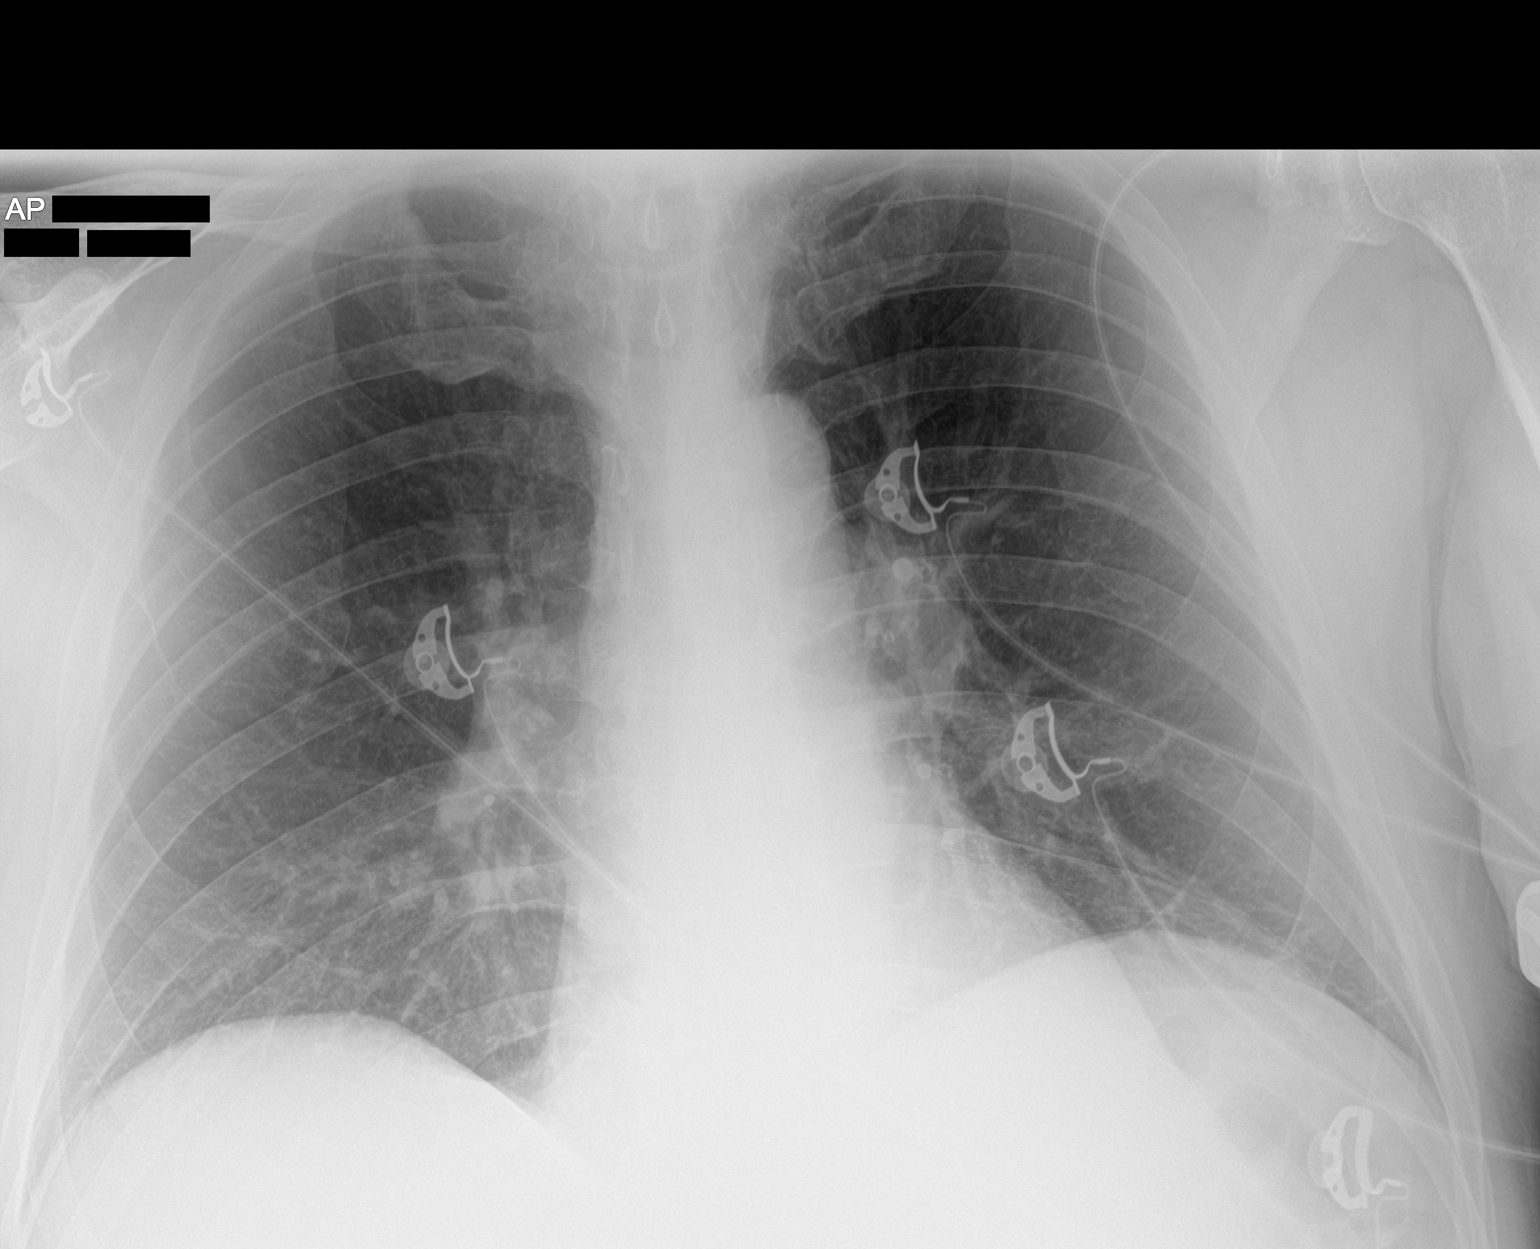

[2 of 2 positions shown; findings below may reference images not displayed]

FINDINGS: Lordotic positioning noted. The heart size and mediastinal contours
are within normal limits. Both lungs are clear. The visualized
skeletal structures are unremarkable.
IMPRESSION: No active disease.

## 2022-11-03 DIAGNOSIS — D225 Melanocytic nevi of trunk: Secondary | ICD-10-CM | POA: Diagnosis not present

## 2022-11-03 DIAGNOSIS — D2262 Melanocytic nevi of left upper limb, including shoulder: Secondary | ICD-10-CM | POA: Diagnosis not present

## 2022-11-03 DIAGNOSIS — Z1283 Encounter for screening for malignant neoplasm of skin: Secondary | ICD-10-CM | POA: Diagnosis not present

## 2022-11-03 DIAGNOSIS — D485 Neoplasm of uncertain behavior of skin: Secondary | ICD-10-CM | POA: Diagnosis not present

## 2022-11-10 DIAGNOSIS — M5136 Other intervertebral disc degeneration, lumbar region: Secondary | ICD-10-CM | POA: Diagnosis not present

## 2022-11-10 DIAGNOSIS — M5135 Other intervertebral disc degeneration, thoracolumbar region: Secondary | ICD-10-CM | POA: Diagnosis not present

## 2022-11-10 DIAGNOSIS — M9903 Segmental and somatic dysfunction of lumbar region: Secondary | ICD-10-CM | POA: Diagnosis not present

## 2022-11-10 DIAGNOSIS — M9902 Segmental and somatic dysfunction of thoracic region: Secondary | ICD-10-CM | POA: Diagnosis not present

## 2022-11-10 DIAGNOSIS — M5417 Radiculopathy, lumbosacral region: Secondary | ICD-10-CM | POA: Diagnosis not present

## 2022-11-10 DIAGNOSIS — M9904 Segmental and somatic dysfunction of sacral region: Secondary | ICD-10-CM | POA: Diagnosis not present

## 2022-11-18 ENCOUNTER — Other Ambulatory Visit: Payer: Self-pay | Admitting: Physician Assistant

## 2022-11-18 DIAGNOSIS — I10 Essential (primary) hypertension: Secondary | ICD-10-CM

## 2022-11-26 ENCOUNTER — Other Ambulatory Visit: Payer: Self-pay | Admitting: Physician Assistant

## 2023-02-20 ENCOUNTER — Other Ambulatory Visit: Payer: Self-pay | Admitting: Physician Assistant

## 2023-02-20 DIAGNOSIS — I10 Essential (primary) hypertension: Secondary | ICD-10-CM

## 2023-02-28 DIAGNOSIS — G4733 Obstructive sleep apnea (adult) (pediatric): Secondary | ICD-10-CM | POA: Diagnosis not present

## 2023-02-28 DIAGNOSIS — J449 Chronic obstructive pulmonary disease, unspecified: Secondary | ICD-10-CM | POA: Diagnosis not present

## 2023-02-28 DIAGNOSIS — J309 Allergic rhinitis, unspecified: Secondary | ICD-10-CM | POA: Diagnosis not present

## 2023-02-28 DIAGNOSIS — Z87892 Personal history of anaphylaxis: Secondary | ICD-10-CM | POA: Diagnosis not present

## 2023-02-28 DIAGNOSIS — Z882 Allergy status to sulfonamides status: Secondary | ICD-10-CM | POA: Diagnosis not present

## 2023-02-28 DIAGNOSIS — Z8249 Family history of ischemic heart disease and other diseases of the circulatory system: Secondary | ICD-10-CM | POA: Diagnosis not present

## 2023-02-28 DIAGNOSIS — I10 Essential (primary) hypertension: Secondary | ICD-10-CM | POA: Diagnosis not present

## 2023-02-28 DIAGNOSIS — Z87891 Personal history of nicotine dependence: Secondary | ICD-10-CM | POA: Diagnosis not present

## 2023-02-28 DIAGNOSIS — E785 Hyperlipidemia, unspecified: Secondary | ICD-10-CM | POA: Diagnosis not present

## 2023-03-02 ENCOUNTER — Encounter (INDEPENDENT_AMBULATORY_CARE_PROVIDER_SITE_OTHER): Payer: Self-pay

## 2023-03-09 ENCOUNTER — Ambulatory Visit (INDEPENDENT_AMBULATORY_CARE_PROVIDER_SITE_OTHER): Payer: Medicare HMO | Admitting: Family

## 2023-03-09 VITALS — BP 130/78 | HR 87 | Temp 98.6°F | Ht 73.0 in | Wt 203.6 lb

## 2023-03-09 DIAGNOSIS — U071 COVID-19: Secondary | ICD-10-CM

## 2023-03-09 DIAGNOSIS — J3089 Other allergic rhinitis: Secondary | ICD-10-CM | POA: Diagnosis not present

## 2023-03-09 DIAGNOSIS — R5383 Other fatigue: Secondary | ICD-10-CM

## 2023-03-09 LAB — POC COVID19 BINAXNOW: SARS Coronavirus 2 Ag: POSITIVE — AB

## 2023-03-09 MED ORDER — LORATADINE 10 MG PO TABS
10.0000 mg | ORAL_TABLET | Freq: Every day | ORAL | 1 refills | Status: DC
Start: 2023-03-09 — End: 2023-09-25

## 2023-03-09 MED ORDER — NIRMATRELVIR/RITONAVIR (PAXLOVID)TABLET
3.0000 | ORAL_TABLET | Freq: Two times a day (BID) | ORAL | 0 refills | Status: AC
Start: 2023-03-09 — End: 2023-03-14

## 2023-03-09 NOTE — Progress Notes (Signed)
Patient ID: Scott Montoya, male    DOB: July 21, 1955, 67 y.o.   MRN: [id number]  Chief Complaint  Patient presents with   Fatigue    sx started today    HPI:      URI sx:  Pt in office not feeling well past few weeks due to runny nose,  but he all of a sudden felt worse today. This morning he reports symptoms of runny nose; fatigue, congestion, productive cough, achy and body soreness. States he did not test for covid, but has had 3 or 4 times in past. Denies ever taking Paxlovid.    Assessment & Plan:  1. COVID-19 Sending Paxlovid, pt advised on use, how to take, & SE. Advised of CDC guidelines for masking if out in public. Advised to stop taking his Crestor for next 5 days, then resume on 6th day. OK to continue taking OTC sinus or pain meds. Encouraged to monitor & notify office of any worsening symptoms: increased shortness of breath, weakness, and signs of dehydration. Instructed to rest and hydrate well.   - POC COVID-19 - nirmatrelvir/ritonavir (PAXLOVID) 20 x 150 MG & 10 x 100MG  TABS; Take 3 tablets by mouth 2 (two) times daily for 5 days. (Take nirmatrelvir 150 mg two tablets twice daily for 5 days and ritonavir 100 mg one tablet twice daily for 5 days)  Dispense: 30 tablet; Refill: 0  2. Non-seasonal allergic rhinitis, unspecified trigger  - loratadine (CLARITIN) 10 MG tablet; Take 1 tablet (10 mg total) by mouth daily.  Dispense: 90 tablet; Refill: 1  3. Other fatigue  - POC COVID-19   Subjective:    Outpatient Medications Prior to Visit  Medication Sig Dispense Refill   albuterol (VENTOLIN HFA) 108 (90 Base) MCG/ACT inhaler INHALE 1 PUFF INTO THE LUNGS EVERY 6 HOURS AS NEEDED FOR WHEEZING OR SHORTNESS OF BREATH. 8.5 each 2   Boswellia-Glucosamine-Vit D (OSTEO BI-FLEX ONE PER DAY PO) Take 1 tablet by mouth in the morning.     chlorthalidone (HYGROTON) 25 MG tablet TAKE 1/2 TABLET BY MOUTH EVERY DAY 45 tablet 3   cholecalciferol (VITAMIN D3) 25 MCG (1000 UNIT) tablet Take  1,000 Units by mouth in the morning.     Cyanocobalamin (VITAMIN B-12 PO) Take 1 tablet by mouth in the morning.     fluticasone (FLONASE) 50 MCG/ACT nasal spray Place 2 sprays into both nostrils daily. 16 g 3   losartan (COZAAR) 100 MG tablet TAKE 1 TABLET BY MOUTH EVERY DAY 90 tablet 1   Multiple Vitamins-Minerals (PRESERVISION AREDS PO) Take 1 tablet by mouth in the morning.     Omega-3 Fatty Acids (FISH OIL) 1000 MG CAPS Take 1,000 mg by mouth in the morning.     Polyethyl Glycol-Propyl Glycol (LUBRICANT EYE DROPS) 0.4-0.3 % SOLN Place 1 drop into both eyes 3 (three) times daily as needed (dry/irritated eyes.).     rosuvastatin (CRESTOR) 40 MG tablet TAKE 1 TABLET BY MOUTH EVERY DAY 90 tablet 3   Turmeric (QC TUMERIC COMPLEX PO) Take 1 capsule by mouth in the morning.     Zinc 50 MG CAPS Take 50 mg by mouth in the morning.     loratadine (CLARITIN) 10 MG tablet TAKE 1 TABLET BY MOUTH EVERY DAY 90 tablet 1   diltiazem (CARDIZEM CD) 240 MG 24 hr capsule Take 1 capsule (240 mg total) by mouth daily. 90 capsule 3   amoxicillin-clavulanate (AUGMENTIN) 875-125 MG tablet Take 1 tablet by mouth 2 (two) times  daily. 14 tablet 0   benzonatate (TESSALON PERLES) 100 MG capsule Take 1 capsule (100 mg total) by mouth 3 (three) times daily as needed for cough. 20 capsule 0   No facility-administered medications prior to visit.   Past Medical History:  Diagnosis Date   Atrial flutter (HCC)    Basal cell carcinoma 09/09/2020   sup-left upper back (CX35FU)- PCP did bx   Cataract    COPD (chronic obstructive pulmonary disease) (HCC)    Hypertension    OSA (obstructive sleep apnea)    Past Surgical History:  Procedure Laterality Date   A-FLUTTER ABLATION N/A 09/03/2021   Procedure: A-FLUTTER ABLATION;  Surgeon: Marinus Maw, MD;  Location: [facility];  Service: Cardiovascular;  Laterality: N/A;   CARPAL TUNNEL RELEASE     EYE SURGERY     foot heel repair Bilateral 2013   HEEL SPUR SURGERY      IR FL GUIDED LOC OF NEEDLE/CATH TIP FOR SPINAL INJECTION LT     ORBITAL RECONSTRUCTION  1994   ROTATOR CUFF REPAIR Right 2018   Allergies  Allergen Reactions   Codeine Other (See Comments)     dry mouth   Sulfa Antibiotics Nausea And Vomiting    Childhood reaction.      Objective:    Physical Exam Vitals and nursing note reviewed.  Constitutional:      General: He is not in acute distress.    Appearance: Normal appearance.  HENT:     Head: Normocephalic.  Cardiovascular:     Rate and Rhythm: Normal rate and regular rhythm.  Pulmonary:     Effort: Pulmonary effort is normal.     Breath sounds: Normal breath sounds.  Musculoskeletal:        General: Normal range of motion.     Cervical back: Normal range of motion.  Skin:    General: Skin is warm and dry.  Neurological:     Mental Status: He is alert and oriented to person, place, and time.  Psychiatric:        Mood and Affect: Mood normal.    BP 130/78 (BP Location: Left Arm)   Pulse 87   Temp 98.6 F (37 C) (Temporal)   Ht 6\' 1"  (1.854 m)   Wt 203 lb 9.6 oz (92.4 kg)   SpO2 98%   BMI 26.86 kg/m  Wt Readings from Last 3 Encounters:  03/09/23 203 lb 9.6 oz (92.4 kg)  08/03/22 222 lb (100.7 kg)  07/20/22 221 lb 9.6 oz (100.5 kg)      Dulce Sellar, NP

## 2023-03-09 NOTE — Patient Instructions (Addendum)
It was very nice to see you today!   You tested positive for Covid-19 today. I have sent over the antiviral treatment, Paxlovid to your pharmacy.  STOP taking the Rosuvastatin (Crestor) for the next 5 days while you are taking the Paxlovid, start back on the day after stopping Paxlovid. Ok to take Acetaminophen (Tylenol) or the Loratadine (Claritin) which can help your runny nose.  Drink plenty of fluids and wear a mask if out in public for next 4 days.        PLEASE NOTE:  If you had any lab tests please let us know if you have not heard back within a few days. You may see your results on MyChart before we have a chance to review them but we will give you a call once they are reviewed by [place]. If we ordered any referrals today, please let us know if you have not heard from their office within the next week.

## 2023-03-13 ENCOUNTER — Other Ambulatory Visit: Payer: Self-pay | Admitting: Physician Assistant

## 2023-04-14 ENCOUNTER — Other Ambulatory Visit: Payer: Self-pay | Admitting: Cardiology

## 2023-04-14 DIAGNOSIS — I4892 Unspecified atrial flutter: Secondary | ICD-10-CM

## 2023-04-20 ENCOUNTER — Telehealth: Payer: Self-pay | Admitting: Physician Assistant

## 2023-04-20 NOTE — Telephone Encounter (Signed)
Pt stated he believes he may have Ortho Static Hypotension. He received this advice from son who is an EMT. Wanted to let his pcp know.

## 2023-04-20 NOTE — Telephone Encounter (Signed)
Sent patient Mychart message to advise needing to schedule an office visit at his convenience.

## 2023-04-20 NOTE — Telephone Encounter (Signed)
Please see pt msg and advise if should schedule a OV to discuss and do Orthostatic vitals

## 2023-04-25 ENCOUNTER — Other Ambulatory Visit: Payer: Self-pay | Admitting: Cardiology

## 2023-04-25 DIAGNOSIS — I4892 Unspecified atrial flutter: Secondary | ICD-10-CM

## 2023-05-09 ENCOUNTER — Ambulatory Visit (INDEPENDENT_AMBULATORY_CARE_PROVIDER_SITE_OTHER): Payer: Medicare HMO

## 2023-05-09 VITALS — BP 118/76 | HR 94 | Temp 97.7°F | Wt 202.5 lb

## 2023-05-09 DIAGNOSIS — Z0000 Encounter for general adult medical examination without abnormal findings: Secondary | ICD-10-CM

## 2023-05-09 NOTE — Patient Instructions (Signed)
Scott Montoya , Thank you for taking time to come for your Medicare Wellness Visit. I appreciate your ongoing commitment to your health goals. Please review the following plan we discussed and let me know if I can assist you in the future.   Referrals/Orders/Follow-Ups/Clinician Recommendations: continue to work on losing weight to get below 200 lbs   Aim for 30 minutes of exercise or brisk walking, 6-8 glasses of water, and 5 servings of fruits and vegetables each day.  If you wish to quit smoking, help is available. For free tobacco cessation program offerings call the [facility] at (986) 326-4655 or Live Well Line at 360-482-6341. You may also visit [email or web address] or email [email or web address] for more information on other programs.    This is a list of the screening recommended for you and due dates:  Health Maintenance  Topic Date Due   Medicare Annual Wellness Visit  04/01/2023   Flu Shot  10/16/2023*   COVID-19 Vaccine (7 - 2023-24 season) 06/06/2023   Colon Cancer Screening  11/13/2030   DTaP/Tdap/Td vaccine (3 - Td or Tdap) 01/23/2032   Pneumonia Vaccine  Completed   Hepatitis C Screening  Completed   Zoster (Shingles) Vaccine  Completed   HPV Vaccine  Aged Out  *Topic was postponed. The date shown is not the original due date.    Advanced directives: (Declined) Advance directive discussed with you today. Even though you declined this today, please call our office should you change your mind, and we can give you the proper paperwork for you to fill out.  Next Medicare Annual Wellness Visit scheduled for next year: No

## 2023-05-09 NOTE — Progress Notes (Signed)
Subjective:   Scott Montoya is a 67 y.o. male who presents for Medicare Annual/Subsequent preventive examination.  Visit Complete: In person  Patient Medicare AWV questionnaire was completed by the patient on 05/08/23; I have confirmed that all information answered by patient is correct and no changes since this date.  Cardiac Risk Factors include: advanced age (>73men, >32 women);dyslipidemia;hypertension;male gender;Other (see comment) (vaping)     Objective:    Today's Vitals   05/09/23 0746  BP: 118/76  Pulse: 94  Temp: 97.7 F (36.5 C)  SpO2: 94%  Weight: 202 lb 8 oz (91.9 kg)   Body mass index is 26.72 kg/m.     05/09/2023    7:56 AM 03/31/2022    8:15 AM 01/22/2022    8:25 AM 09/04/2021   11:00 AM 03/18/2021    8:23 AM 06/24/2020    3:55 PM 06/19/2020    7:59 PM  Advanced Directives  Does Patient Have a Medical Advance Directive? No Yes No No No No No  Type of Furniture conservator/restorer;Living will       Does patient want to make changes to medical advance directive?     Yes (MAU/Ambulatory/Procedural Areas - Information given)    Copy of Healthcare Power of Attorney in Chart?  No - copy requested       Would patient like information on creating a medical advance directive? No - Patient declined  No - Patient declined Yes (Inpatient - patient defers creating a medical advance directive at this time - Information given)  No - Guardian declined No - Patient declined    Current Medications (verified) Outpatient Encounter Medications as of 05/09/2023  Medication Sig   albuterol (VENTOLIN HFA) 108 (90 Base) MCG/ACT inhaler INHALE 1 PUFF INTO THE LUNGS EVERY 6 HOURS AS NEEDED FOR WHEEZING OR SHORTNESS OF BREATH.   Boswellia-Glucosamine-Vit D (OSTEO BI-FLEX ONE PER DAY PO) Take 1 tablet by mouth in the morning.   chlorthalidone (HYGROTON) 25 MG tablet TAKE 1/2 TABLET BY MOUTH EVERY DAY   cholecalciferol (VITAMIN D3) 25 MCG (1000 UNIT) tablet Take 1,000  Units by mouth in the morning.   Cyanocobalamin (VITAMIN B-12 PO) Take 1 tablet by mouth in the morning.   diltiazem (CARDIZEM CD) 240 MG 24 hr capsule Take 1 capsule (240 mg total) by mouth daily. NEED OV.   loratadine (CLARITIN) 10 MG tablet Take 1 tablet (10 mg total) by mouth daily.   losartan (COZAAR) 100 MG tablet TAKE 1 TABLET BY MOUTH EVERY DAY   Multiple Vitamins-Minerals (PRESERVISION AREDS PO) Take 1 tablet by mouth in the morning.   Omega-3 Fatty Acids (FISH OIL) 1000 MG CAPS Take 1,000 mg by mouth in the morning.   Polyethyl Glycol-Propyl Glycol (LUBRICANT EYE DROPS) 0.4-0.3 % SOLN Place 1 drop into both eyes 3 (three) times daily as needed (dry/irritated eyes.).   rosuvastatin (CRESTOR) 40 MG tablet TAKE 1 TABLET BY MOUTH EVERY DAY   Turmeric (QC TUMERIC COMPLEX PO) Take 1 capsule by mouth in the morning.   Zinc 50 MG CAPS Take 50 mg by mouth in the morning.   [DISCONTINUED] fluticasone (FLONASE) 50 MCG/ACT nasal spray SPRAY 2 SPRAYS INTO EACH NOSTRIL EVERY DAY   No facility-administered encounter medications on file as of 05/09/2023.    Allergies (verified) Codeine and Sulfa antibiotics   History: Past Medical History:  Diagnosis Date   Atrial flutter (HCC)    Basal cell carcinoma 09/09/2020   sup-left upper back (CX35FU)-  PCP did bx   Cataract    COPD (chronic obstructive pulmonary disease) (HCC)    Hypertension    OSA (obstructive sleep apnea)    Past Surgical History:  Procedure Laterality Date   A-FLUTTER ABLATION N/A 09/03/2021   Procedure: A-FLUTTER ABLATION;  Surgeon: Marinus Maw, MD;  Location: [facility];  Service: Cardiovascular;  Laterality: N/A;   CARPAL TUNNEL RELEASE     EYE SURGERY     foot heel repair Bilateral 2013   HEEL SPUR SURGERY     IR FL GUIDED LOC OF NEEDLE/CATH TIP FOR SPINAL INJECTION LT     ORBITAL RECONSTRUCTION  1994   ROTATOR CUFF REPAIR Right 2018   Family History  Problem Relation Age of Onset   Heart attack Father     Cancer Maternal Grandmother    Cancer Maternal Grandfather    Heart attack Paternal Grandfather    Social History   Socioeconomic History   Marital status: Divorced    Spouse name: Not on file   Number of children: 3   Years of education: Not on file   Highest education level: Not on file  Occupational History   Not on file  Tobacco Use   Smoking status: Former    Types: Cigarettes   Smokeless tobacco: Never  Vaping Use   Vaping status: Some Days   Devices: Juul  Substance and Sexual Activity   Alcohol use: Yes    Alcohol/week: 28.0 standard drinks of alcohol    Types: 14 Glasses of wine, 14 Cans of beer per week    Comment: 4 drinks per day   Drug use: Not Currently    Types: Marijuana   Sexual activity: Yes    Birth control/protection: None  Other Topics Concern   Not on file  Social History Narrative   Not on file   Social Determinants of Health   Financial Resource Strain: Low Risk  (03/31/2022)   Overall Financial Resource Strain (CARDIA)    Difficulty of Paying Living Expenses: Not hard at all  Food Insecurity: No Food Insecurity (05/08/2023)   Hunger Vital Sign    Worried About Running Out of Food in the Last Year: Never true    Ran Out of Food in the Last Year: Never true  Transportation Needs: No Transportation Needs (05/08/2023)   PRAPARE - Administrator, Civil Service (Medical): No    Lack of Transportation (Non-Medical): No  Physical Activity: Sufficiently Active (05/08/2023)   Exercise Vital Sign    Days of Exercise per Week: 5 days    Minutes of Exercise per Session: 50 min  Stress: No Stress Concern Present (05/08/2023)   [employer] of Occupational Health - Occupational Stress Questionnaire    Feeling of Stress : Not at all  Social Connections: Moderately Integrated (05/08/2023)   Social Connection and Isolation Panel [NHANES]    Frequency of Communication with Friends and Family: More than three times a week    Frequency  of Social Gatherings with Friends and Family: Once a week    Attends Religious Services: 1 to 4 times per year    Active Member of [employer] or Organizations: Yes    Attends Engineer, structural: More than 4 times per year    Marital Status: Divorced    Tobacco Counseling Counseling given: Not Answered   Clinical Intake:  Pre-visit preparation completed: Yes  Pain : No/denies pain     BMI - recorded: 26.72 Nutritional Status:  BMI 25 -29 Overweight Nutritional Risks: None Diabetes: No  How often do you need to have someone help you when you read instructions, pamphlets, or other written materials from your doctor or pharmacy?: 1 - Never  Interpreter Needed?: No  Information entered by :: Lanier Ensign, LPN   Activities of Daily Living    05/08/2023    9:46 AM 04/18/2023    2:47 PM  In your present state of health, do you have any difficulty performing the following activities:  Hearing? 0 0  Vision? 0 0  Difficulty concentrating or making decisions? 0 0  Walking or climbing stairs? 0 0  Dressing or bathing? 0 0  Doing errands, shopping? 0 0  Preparing Food and eating ? N N  Using the Toilet? N N  In the past six months, have you accidently leaked urine? Y Y  Comment at times   Do you have problems with loss of bowel control? N N  Managing your Medications? N N  Managing your Finances? N N  Housekeeping or managing your Housekeeping? N N    Patient Care Team: Allwardt, Crist Infante, PA-C as PCP - General (Physician Assistant) Lewayne Bunting, MD as PCP - Cardiology (Cardiology) Marinus Maw, MD as PCP - Electrophysiology (Cardiology) Janalyn Harder, MD (Inactive) as Consulting Physician (Dermatology)  Indicate any recent Medical Services you may have received from other than [facility] providers in the past year (date may be approximate).     Assessment:   This is a routine wellness examination for Jaelon.  Hearing/Vision screen Hearing Screening -  Comments:: Pt denies any hearing issues  Vision Screening - Comments:: Pt follows up with provider for annual eye exams    Goals Addressed             This Visit's Progress    Patient Stated       Continue to work on getting below 200 pounds        Depression Screen    05/09/2023    7:57 AM 03/31/2022    8:12 AM 03/18/2021    8:22 AM 12/30/2019   10:34 AM  PHQ 2/9 Scores  PHQ - 2 Score 0 2 0 0  PHQ- 9 Score  3      Fall Risk    05/08/2023    9:46 AM 04/18/2023    2:47 PM 04/07/2023   11:13 AM 03/31/2022    8:15 AM 03/18/2021    8:24 AM  Fall Risk   Falls in the past year? 0 0 0 0 1  Number falls in past yr: 0 0 0 0 1  Injury with Fall? 0 0 0 0 1  Comment     left eye suture  Risk for fall due to : Impaired vision   No Fall Risks;Impaired vision Impaired vision  Follow up Falls prevention discussed   Falls prevention discussed Falls prevention discussed    MEDICARE RISK AT HOME: Medicare Risk at Home Any stairs in or around the home?: Yes If so, are there any without handrails?: No Home free of loose throw rugs in walkways, pet beds, electrical cords, etc?: Yes Adequate lighting in your home to reduce risk of falls?: Yes Life alert?: No Use of a cane, walker or w/c?: No Grab bars in the bathroom?: Yes Shower chair or bench in shower?: No Elevated toilet seat or a handicapped toilet?: No  TIMED UP AND GO:  Was the test performed?  Yes  Length of time to  ambulate 10 feet: 10 sec Gait steady and fast without use of assistive device    Cognitive Function:        05/09/2023    7:59 AM 03/31/2022    8:17 AM 03/18/2021    8:28 AM  6CIT Screen  What Year? 0 points 0 points 0 points  What month? 0 points 0 points 0 points  What time? 0 points 0 points 0 points  Count back from 20 0 points 0 points 0 points  Months in reverse 0 points 0 points 0 points  Repeat phrase 0 points 0 points 0 points  Total Score 0 points 0 points 0 points     Immunizations Immunization History  Administered Date(s) Administered   Fluad Quad(high Dose 65+) 06/03/2022   Influenza,inj,Quad PF,6+ Mos 04/21/2020   Influenza-Unspecified 03/24/2021, 04/18/2023   Moderna Covid-19 Fall Seasonal Vaccine 86yrs & older 06/03/2022   Moderna Sars-Covid-2 Vaccination 10/24/2019, 11/25/2019   PFIZER(Purple Top)SARS-COV-2 Vaccination 07/03/2020, 12/10/2020   PNEUMOCOCCAL CONJUGATE-20 12/10/2020   Pfizer(Comirnaty)Fall Seasonal Vaccine 12 years and older 04/11/2023   Pneumococcal Conjugate-13 10/01/2020   Tdap 02/26/2012, 01/22/2022   Zoster Recombinant(Shingrix) 03/24/2021, 06/24/2021    TDAP status: Up to date  Flu Vaccine status: Up to date  Pneumococcal vaccine status: Up to date  Covid-19 vaccine status: Completed vaccines  Qualifies for Shingles Vaccine? Yes   Zostavax completed Yes   Shingrix Completed?: Yes  Screening Tests Health Maintenance  Topic Date Due   COVID-19 Vaccine (7 - 2023-24 season) 06/06/2023   Medicare Annual Wellness (AWV)  05/08/2024   Colonoscopy  11/13/2030   DTaP/Tdap/Td (3 - Td or Tdap) 01/23/2032   Pneumonia Vaccine 60+ Years old  Completed   INFLUENZA VACCINE  Completed   Hepatitis C Screening  Completed   Zoster Vaccines- Shingrix  Completed   HPV VACCINES  Aged Out    Health Maintenance  There are no preventive care reminders to display for this patient.   Colorectal cancer screening: Type of screening: Colonoscopy. Completed 11/12/20. Repeat every 10 years   Additional Screening:  Hepatitis C Screening: Completed 12/30/19  Vision Screening: Recommended annual ophthalmology exams for early detection of glaucoma and other disorders of the eye. Is the patient up to date with their annual eye exam?  Yes  Who is the provider or what is the name of the office in which the patient attends annual eye exams? Eye provider  If pt is not established with a provider, would they like to be referred to a  provider to establish care? No .   Dental Screening: Recommended annual dental exams for proper oral hygiene   Community Resource Referral / Chronic Care Management: CRR required this visit?  No   CCM required this visit?  No     Plan:     I have personally reviewed and noted the following in the patient's chart:   Medical and social history Use of alcohol, tobacco or illicit drugs  Current medications and supplements including opioid prescriptions. Patient is not currently taking opioid prescriptions. Functional ability and status Nutritional status Physical activity Advanced directives List of other physicians Hospitalizations, surgeries, and ER visits in previous 12 months Vitals Screenings to include cognitive, depression, and falls Referrals and appointments  In addition, I have reviewed and discussed with patient certain preventive protocols, quality metrics, and best practice recommendations. A written personalized care plan for preventive services as well as general preventive health recommendations were provided to patient.     Verdon Cummins  Ashayla Subia, LPN   [id number]   After Visit Summary: (MyChart) Due to this being a telephonic visit, the after visit summary with patients personalized plan was offered to patient via MyChart   Nurse Notes: none

## 2023-05-19 DIAGNOSIS — H353130 Nonexudative age-related macular degeneration, bilateral, stage unspecified: Secondary | ICD-10-CM | POA: Diagnosis not present

## 2023-05-19 DIAGNOSIS — H5212 Myopia, left eye: Secondary | ICD-10-CM | POA: Diagnosis not present

## 2023-05-19 DIAGNOSIS — H35372 Puckering of macula, left eye: Secondary | ICD-10-CM | POA: Diagnosis not present

## 2023-05-19 DIAGNOSIS — Z961 Presence of intraocular lens: Secondary | ICD-10-CM | POA: Diagnosis not present

## 2023-05-19 DIAGNOSIS — H35033 Hypertensive retinopathy, bilateral: Secondary | ICD-10-CM | POA: Diagnosis not present

## 2023-05-19 DIAGNOSIS — H04123 Dry eye syndrome of bilateral lacrimal glands: Secondary | ICD-10-CM | POA: Diagnosis not present

## 2023-05-19 DIAGNOSIS — H5201 Hypermetropia, right eye: Secondary | ICD-10-CM | POA: Diagnosis not present

## 2023-05-19 DIAGNOSIS — H52221 Regular astigmatism, right eye: Secondary | ICD-10-CM | POA: Diagnosis not present

## 2023-05-19 DIAGNOSIS — H43812 Vitreous degeneration, left eye: Secondary | ICD-10-CM | POA: Diagnosis not present

## 2023-06-07 ENCOUNTER — Ambulatory Visit: Payer: Medicare HMO | Admitting: Physician Assistant

## 2023-06-07 ENCOUNTER — Ambulatory Visit (INDEPENDENT_AMBULATORY_CARE_PROVIDER_SITE_OTHER): Payer: Medicare HMO | Admitting: Family

## 2023-06-07 VITALS — BP 128/76 | HR 67 | Temp 97.5°F | Ht 73.0 in | Wt 211.2 lb

## 2023-06-07 DIAGNOSIS — L219 Seborrheic dermatitis, unspecified: Secondary | ICD-10-CM | POA: Diagnosis not present

## 2023-06-07 DIAGNOSIS — I483 Typical atrial flutter: Secondary | ICD-10-CM | POA: Diagnosis not present

## 2023-06-07 DIAGNOSIS — L989 Disorder of the skin and subcutaneous tissue, unspecified: Secondary | ICD-10-CM

## 2023-06-07 DIAGNOSIS — I951 Orthostatic hypotension: Secondary | ICD-10-CM | POA: Diagnosis not present

## 2023-06-07 NOTE — Progress Notes (Signed)
Patient ID: Scott Montoya, male    DOB: 05/22/1956, 67 y.o.   MRN: [id number]  Chief Complaint  Patient presents with   Hair/Scalp Problem    Pt c/o itchy scalp and dry skin, Noticed for several months.    Dizziness    Pt c/o dizziness and lightness when standing at times. Off and on for the past couple months.    Discussed the use of AI scribe software for clinical note transcription with the patient, who gave verbal consent to proceed.  History of Present Illness   The patient presents with itchy, dry scalp with small, crusty bumps that he feels compelled to pick off. He also has a similar lesion on his ear. He has noticed some flakiness on the scalp but denies having dandruff on his body or noticed on clothing. He has not tried any over-the-counter remedies such as Head and Shoulders. He has seen a dermatologist in the past but that provider has since retired.Pt is due to retire and move to [place], [place] in March.  In addition, the patient experiences occasional dizziness, particularly upon standing up quickly. This does not occur regularly and can be several weeks or months apart. He has been trying to maintain a weight of around 203 lbs through intermittent fasting, typically eating lunch and dinner but skipping breakfast. He ensures he stays hydrated, particularly in the morning. He exercises regularly, [facility]     Assessment & Plan:     Scalp Dermatitis - Itchy, dry scalp with flaking and crusty bumps.  -Recommend trial of Head and Shoulders shampoo daily. -Referral to Dermatology for further evaluation and management.  Ear Lesion - Crusty lesion on the ear, possibly sun-related damage. -Referral to Dermatology for evaluation and possible biopsy.  Orthostatic Hypotension - Infrequent episodes of dizziness upon standing. No consistent pattern identified. Patient is on Cardizem and Losartan for hypertension and  low dose Chlorthalidone as a diuretic. -Advise to stay well-hydrated, especially in the morning. -Consider splitting up Cardizem & Losartan, taking the Cardizem in evenings if dizziness persists. -Check heart rate during episodes of dizziness. -F/U with PCP for ongoing sx  Atrial Flutter - History of atrial flutter, treated with cardioversion. Currently on Cardizem for rate control. No recent episodes of atrial flutter noted on watch. -Continue Cardizem as prescribed. -Monitor heart rate especially when having a dizzy episode.  -F/U with PCP if sx persist    Subjective:    Outpatient Medications Prior to Visit  Medication Sig Dispense Refill   albuterol (VENTOLIN HFA) 108 (90 Base) MCG/ACT inhaler INHALE 1 PUFF INTO THE LUNGS EVERY 6 HOURS AS NEEDED FOR WHEEZING OR SHORTNESS OF BREATH. 8.5 each 2   Boswellia-Glucosamine-Vit D (OSTEO BI-FLEX ONE PER DAY PO) Take 1 tablet by mouth in the morning.     chlorthalidone (HYGROTON) 25 MG tablet TAKE 1/2 TABLET BY MOUTH EVERY DAY 45 tablet 3   cholecalciferol (VITAMIN D3) 25 MCG (1000 UNIT) tablet Take 1,000 Units by mouth in the morning.     Cyanocobalamin (VITAMIN B-12 PO) Take 1 tablet by mouth in the morning.     diltiazem (CARDIZEM CD) 240 MG 24 hr capsule Take 1 capsule (240 mg total) by mouth daily. NEED OV. 90 capsule 0   loratadine (CLARITIN) 10 MG tablet Take 1 tablet (10 mg total) by mouth daily. 90 tablet 1   losartan (COZAAR) 100 MG tablet TAKE 1 TABLET BY MOUTH EVERY  DAY 90 tablet 1   Multiple Vitamins-Minerals (PRESERVISION AREDS PO) Take 1 tablet by mouth in the morning.     Omega-3 Fatty Acids (FISH OIL) 1000 MG CAPS Take 1,000 mg by mouth in the morning.     Polyethyl Glycol-Propyl Glycol (LUBRICANT EYE DROPS) 0.4-0.3 % SOLN Place 1 drop into both eyes 3 (three) times daily as needed (dry/irritated eyes.).     rosuvastatin (CRESTOR) 40 MG tablet TAKE 1 TABLET BY MOUTH EVERY DAY 90 tablet 3   Turmeric (QC TUMERIC COMPLEX PO) Take  1 capsule by mouth in the morning.     Zinc 50 MG CAPS Take 50 mg by mouth in the morning.     No facility-administered medications prior to visit.   Past Medical History:  Diagnosis Date   Atrial flutter (HCC)    Basal cell carcinoma 09/09/2020   sup-left upper back (CX35FU)- PCP did bx   Cataract    COPD (chronic obstructive pulmonary disease) (HCC)    Hypertension    OSA (obstructive sleep apnea)    Past Surgical History:  Procedure Laterality Date   A-FLUTTER ABLATION N/A 09/03/2021   Procedure: A-FLUTTER ABLATION;  Surgeon: Marinus Maw, MD;  Location: [facility];  Service: Cardiovascular;  Laterality: N/A;   CARPAL TUNNEL RELEASE     EYE SURGERY     foot heel repair Bilateral 2013   HEEL SPUR SURGERY     IR FL GUIDED LOC OF NEEDLE/CATH TIP FOR SPINAL INJECTION LT     ORBITAL RECONSTRUCTION  1994   ROTATOR CUFF REPAIR Right 2018   Allergies  Allergen Reactions   Codeine Other (See Comments)     dry mouth   Sulfa Antibiotics Nausea And Vomiting    Childhood reaction.      Objective:    Physical Exam Vitals and nursing note reviewed.  Constitutional:      General: He is not in acute distress.    Appearance: Normal appearance.  HENT:     Head: Normocephalic.  Cardiovascular:     Rate and Rhythm: Normal rate and regular rhythm.  Pulmonary:     Effort: Pulmonary effort is normal.     Breath sounds: Normal breath sounds.  Musculoskeletal:        General: Normal range of motion.     Cervical back: Normal range of motion.  Skin:    General: Skin is warm and dry.  Neurological:     Mental Status: He is alert and oriented to person, place, and time.  Psychiatric:        Mood and Affect: Mood normal.    BP 128/76 (BP Location: Left Arm, Patient Position: Sitting, Cuff Size: Large)   Pulse 67   Temp (!) 97.5 F (36.4 C) (Temporal)   Ht 6\' 1"  (1.854 m)   Wt 211 lb 3.2 oz (95.8 kg)   SpO2 98%   BMI 27.86 kg/m  Wt Readings from Last 3 Encounters:   06/07/23 211 lb 3.2 oz (95.8 kg)  05/09/23 202 lb 8 oz (91.9 kg)  03/09/23 203 lb 9.6 oz (92.4 kg)      Dulce Sellar, NP

## 2023-07-01 ENCOUNTER — Other Ambulatory Visit: Payer: Self-pay | Admitting: Cardiology

## 2023-07-01 DIAGNOSIS — I2510 Atherosclerotic heart disease of native coronary artery without angina pectoris: Secondary | ICD-10-CM

## 2023-07-19 ENCOUNTER — Other Ambulatory Visit: Payer: Self-pay | Admitting: Cardiology

## 2023-07-19 DIAGNOSIS — I10 Essential (primary) hypertension: Secondary | ICD-10-CM

## 2023-07-21 ENCOUNTER — Telehealth: Payer: Self-pay | Admitting: Physician Assistant

## 2023-07-21 ENCOUNTER — Other Ambulatory Visit: Payer: Self-pay | Admitting: Physician Assistant

## 2023-07-21 DIAGNOSIS — I10 Essential (primary) hypertension: Secondary | ICD-10-CM

## 2023-07-21 NOTE — Telephone Encounter (Signed)
 Please see pt call note and advise; appears that last labs were completed in 2023.

## 2023-07-21 NOTE — Telephone Encounter (Signed)
 Copied from CRM [id number]. Topic: Clinical - Medication Refill >> Jul 21, 2023  1:54 PM Eleanor C wrote: Most Recent Primary Care Visit:  Provider: LUCIUS KRABBE  Department: [facility]  Visit Type: OFFICE VISIT  Date: 06/07/2023  Medication: chlorthalidone  (HYGROTON ) 25 MG tablet   Has the patient contacted their pharmacy? No (Agent: If no, request that the patient contact the pharmacy for the refill. If patient does not wish to contact the pharmacy document the reason why and proceed with request.) (Agent: If yes, when and what did the pharmacy advise?)  Is this the correct pharmacy for this prescription? Yes If no, delete pharmacy and type the correct one.  This is the patient's preferred pharmacy:  [id number] [place], [place] - [address] [place] [address] Phone: [phone] Fax: [phone]     Has the prescription been filled recently? Yes - NOTE- prescription was previously a 90 day prescription through his cardiologist but he is no longer a patient there and they said it needs to be picked up by his PCP. He just picked up a 30 day prescription so will not need this prescription right away.   Is the patient out of the medication? No  Has the patient been seen for an appointment in the last year OR does the patient have an upcoming appointment? Yes  Can we respond through MyChart? Yes  Agent: Please be advised that Rx refills may take up to 3 business days. We ask that you follow-up with your pharmacy.

## 2023-07-21 NOTE — Telephone Encounter (Signed)
 Copied from CRM [id number]. Topic: Clinical - Medical Advice >> Jul 21, 2023  1:59 PM Melissa C wrote: Reason for CRM: patient was just wondering if he needed blood or urine tests. He was thinking back and knows he hasn't had any done recently. If none are needed he said that is fine, he just doesn't want anything overlooked. Please advise with patient. Thank you.

## 2023-07-21 NOTE — Telephone Encounter (Signed)
 Sent patient a Clinical cytogeneticist message with PCP recommendations

## 2023-07-24 ENCOUNTER — Other Ambulatory Visit: Payer: Self-pay | Admitting: Cardiology

## 2023-07-24 DIAGNOSIS — I4892 Unspecified atrial flutter: Secondary | ICD-10-CM

## 2023-07-26 ENCOUNTER — Other Ambulatory Visit: Payer: Self-pay | Admitting: Cardiology

## 2023-07-26 DIAGNOSIS — I2510 Atherosclerotic heart disease of native coronary artery without angina pectoris: Secondary | ICD-10-CM

## 2023-08-08 ENCOUNTER — Other Ambulatory Visit: Payer: Self-pay

## 2023-08-08 ENCOUNTER — Emergency Department (HOSPITAL_BASED_OUTPATIENT_CLINIC_OR_DEPARTMENT_OTHER): Payer: Medicare HMO | Admitting: Radiology

## 2023-08-08 ENCOUNTER — Emergency Department (HOSPITAL_BASED_OUTPATIENT_CLINIC_OR_DEPARTMENT_OTHER)
Admission: EM | Admit: 2023-08-08 | Discharge: 2023-08-09 | Disposition: A | Discharge status: Home / Self Care | Payer: Medicare HMO | Attending: Emergency Medicine | Admitting: Emergency Medicine

## 2023-08-08 DIAGNOSIS — J449 Chronic obstructive pulmonary disease, unspecified: Secondary | ICD-10-CM | POA: Insufficient documentation

## 2023-08-08 DIAGNOSIS — R0602 Shortness of breath: Secondary | ICD-10-CM | POA: Insufficient documentation

## 2023-08-08 DIAGNOSIS — R066 Hiccough: Secondary | ICD-10-CM | POA: Diagnosis not present

## 2023-08-08 DIAGNOSIS — Z7951 Long term (current) use of inhaled steroids: Secondary | ICD-10-CM | POA: Diagnosis not present

## 2023-08-08 DIAGNOSIS — R0989 Other specified symptoms and signs involving the circulatory and respiratory systems: Secondary | ICD-10-CM | POA: Diagnosis not present

## 2023-08-08 DIAGNOSIS — Z79899 Other long term (current) drug therapy: Secondary | ICD-10-CM | POA: Diagnosis not present

## 2023-08-08 DIAGNOSIS — I10 Essential (primary) hypertension: Secondary | ICD-10-CM | POA: Diagnosis not present

## 2023-08-08 LAB — CBC
HCT: 41.9 % (ref 39.0–52.0)
Hemoglobin: 14.8 g/dL (ref 13.0–17.0)
MCH: 32.9 pg (ref 26.0–34.0)
MCHC: 35.3 g/dL (ref 30.0–36.0)
MCV: 93.1 fL (ref 80.0–100.0)
Platelets: 181 10*3/uL (ref 150–400)
RBC: 4.5 MIL/uL (ref 4.22–5.81)
RDW: 11.9 % (ref 11.5–15.5)
WBC: 9.2 10*3/uL (ref 4.0–10.5)
nRBC: 0 % (ref 0.0–0.2)

## 2023-08-08 LAB — BASIC METABOLIC PANEL
Anion gap: 13 (ref 5–15)
BUN: 12 mg/dL (ref 8–23)
CO2: 26 mmol/L (ref 22–32)
Calcium: 9.6 mg/dL (ref 8.9–10.3)
Chloride: 97 mmol/L — ABNORMAL LOW (ref 98–111)
Creatinine, Ser: 0.78 mg/dL (ref 0.61–1.24)
GFR, Estimated: >60 mL/min (ref >60)
Glucose, Bld: 94 mg/dL (ref 70–99)
Potassium: 3.8 mmol/L (ref 3.5–5.1)
Sodium: 136 mmol/L (ref 135–145)

## 2023-08-08 LAB — TROPONIN I (HIGH SENSITIVITY): Troponin I (High Sensitivity): 14 ng/L (ref <18)

## 2023-08-08 MED ORDER — CHLORPROMAZINE HCL 25 MG PO TABS
25.0000 mg | ORAL_TABLET | Freq: Once | ORAL | Status: AC
Start: 2023-08-08 — End: 2023-08-08
  Administered 2023-08-08: 25 mg via ORAL
  Filled 2023-08-08: qty 1

## 2023-08-08 MED ORDER — IPRATROPIUM-ALBUTEROL 0.5-2.5 (3) MG/3ML IN SOLN
3.0000 mL | Freq: Once | RESPIRATORY_TRACT | Status: AC
  Administered 2023-08-08: 3 mL via RESPIRATORY_TRACT
  Filled 2023-08-08: qty 3

## 2023-08-08 NOTE — ED Provider Notes (Signed)
[place] EMERGENCY DEPARTMENT AT [facility] Provider Note   CSN: [id number] Arrival date & time: 08/08/23  2135     History {Add pertinent medical, surgical, social history, OB history to HPI:1} Chief Complaint  Patient presents with   Shortness of Breath    Boleslaw Markowitz is a 68 y.o. male.  The history is provided by the patient.  Shortness of Breath He has history of hypertension, hyperlipidemia, COPD, atrial flutter not on anticoagulation since radiofrequency ablation and comes in complaining of hiccups for the last 3 days.  This afternoon, he noted that he gets short of breath if he lays flat.  He denies dyspnea with exertion.  He denies chest pain, heaviness, tightness, pressure.  He did try using albuterol inhaler without any benefit.  He denies fever or chills.  He denies coughing.  He is a non-smoker, but does vape.   Home Medications Prior to Admission medications   Medication Sig Start Date End Date Taking? Authorizing Provider  albuterol (VENTOLIN HFA) 108 (90 Base) MCG/ACT inhaler INHALE 1 PUFF INTO THE LUNGS EVERY 6 HOURS AS NEEDED FOR WHEEZING OR SHORTNESS OF BREATH. 11/28/22   Allwardt, Alyssa M, PA-C  Boswellia-Glucosamine-Vit D (OSTEO BI-FLEX ONE PER DAY PO) Take 1 tablet by mouth in the morning.    [provider]  chlorthalidone (HYGROTON) 25 MG tablet TAKE 1/2 TABLET BY MOUTH DAILY 07/20/23   Marinus Maw, MD  cholecalciferol (VITAMIN D3) 25 MCG (1000 UNIT) tablet Take 1,000 Units by mouth in the morning.    [provider]  Cyanocobalamin (VITAMIN B-12 PO) Take 1 tablet by mouth in the morning.    [provider]  diltiazem (CARDIZEM CD) 240 MG 24 hr capsule Take 1 capsule (240 mg total) by mouth daily. NEED OV 07/25/23   Lewayne Bunting, MD  loratadine (CLARITIN) 10 MG tablet Take 1 tablet (10 mg total) by mouth daily. 03/09/23   Dulce Sellar, NP  losartan (COZAAR) 100 MG tablet TAKE 1 TABLET BY MOUTH EVERY DAY 02/21/23    Allwardt, Alyssa M, PA-C  Multiple Vitamins-Minerals (PRESERVISION AREDS PO) Take 1 tablet by mouth in the morning.    [provider]  Omega-3 Fatty Acids (FISH OIL) 1000 MG CAPS Take 1,000 mg by mouth in the morning.    [provider]  Polyethyl Glycol-Propyl Glycol (LUBRICANT EYE DROPS) 0.4-0.3 % SOLN Place 1 drop into both eyes 3 (three) times daily as needed (dry/irritated eyes.).    [provider]  rosuvastatin (CRESTOR) 40 MG tablet TAKE 1 TABLET BY MOUTH EVERY DAY 07/26/23   Marinus Maw, MD  Turmeric (QC TUMERIC COMPLEX PO) Take 1 capsule by mouth in the morning.    [provider]  Zinc 50 MG CAPS Take 50 mg by mouth in the morning.    [provider]      Allergies    Codeine and Sulfa antibiotics    Review of Systems   Review of Systems  Respiratory:  Positive for shortness of breath.   All other systems reviewed and are negative.   Physical Exam Updated Vital Signs BP 118/78 (BP Location: Right Arm)   Pulse 80   Temp 98.6 F (37 C)   Resp (!) 22   Ht 6\' 1"  (1.854 m)   Wt 95.7 kg   SpO2 99%   BMI 27.84 kg/m  Physical Exam Vitals and nursing note reviewed.   68 year old male, resting comfortably and in no acute distress.  Vital signs are significant for slightly elevated respiratory rate. Oxygen saturation is 99%, which is normal. Head is normocephalic and atraumatic. PERRLA, EOMI. Oropharynx is clear. Neck is nontender and supple without adenopathy or JVD. Back is nontender and there is no CVA tenderness.  There is no presacral edema. Lungs are clear without rales, wheezes, or rhonchi.  There is a prolonged exhalation phase. Chest is nontender. Heart has regular rate and rhythm without murmur. Abdomen is soft, flat, nontender. Extremities have no cyanosis or edema, full range of motion is present. Skin is warm and dry without rash. Neurologic: Mental status is normal, moves all extremities equally.  ED Results /  Procedures / Treatments   Labs (all labs ordered are listed, but only abnormal results are displayed) Labs Reviewed  BASIC METABOLIC PANEL - Abnormal; Notable for the following components:      Result Value   Chloride 97 (*)    All other components within normal limits  CBC  TROPONIN I (HIGH SENSITIVITY)  TROPONIN I (HIGH SENSITIVITY)    EKG EKG Interpretation Date/Time:  Tuesday August 08 2023 21:44:41 EST Ventricular Rate:  78 PR Interval:  172 QRS Duration:  100 QT Interval:  394 QTC Calculation: 449 R Axis:   11  Text Interpretation: Normal sinus rhythm Nonspecific ST abnormality Abnormal ECG When compared with ECG of 04-Sep-2021 04:31, No significant change was found Confirmed by Dione Booze ([id number]) on 08/08/2023 11:25:55 PM  Radiology DG Chest 2 View Result Date: 08/08/2023 CLINICAL DATA:  Shortness of breath and congestion EXAM: CHEST - 2 VIEW COMPARISON:  06/18/2021 FINDINGS: Stable cardiomediastinal silhouette. Left basilar atelectasis/scarring. The lungs are otherwise clear. No pleural effusion or pneumothorax. No displaced rib fractures. IMPRESSION: No acute cardiopulmonary process. Electronically Signed   By: Minerva Fester M.D.   On: 08/08/2023 22:56    Procedures Procedures  {Document cardiac monitor, telemetry assessment procedure when appropriate:1}  Medications Ordered in ED Medications  ipratropium-albuterol (DUONEB) 0.5-2.5 (3) MG/3ML nebulizer solution 3 mL (has no administration in time range)  chlorproMAZINE (THORAZINE) tablet 25 mg (has no administration in time range)    ED Course/ Medical Decision Making/ A&P   {   Click here for ABCD2, HEART and other calculatorsREFRESH Note before signing :1}                              Medical Decision Making Amount and/or Complexity of Data Reviewed Labs: ordered. Radiology: ordered.   Orthopnea concerning for possible heart failure (no other signs of heart failure evident on exam).  Prolonged  exhalation phase suggesting he might have some degree of bronchospasm.  Doubt pneumonia.  Hiccups of uncertain cause.  I have reviewed his electrocardiogram, and my interpretation is minor, nonspecific ST changes unchanged from prior.  Chest x-ray shows no acute cardiopulmonary process.  Have independently viewed the images, and agree with the radiologist's interpretation.  I have reviewed his laboratory tests, and my interpretation is normal basic metabolic panel, normal troponin, normal CBC.  I have ordered a therapeutic trial of albuterol with ipratropium and I have also ordered a dose of chlorpromazine for his hiccups.  I have added a BNP to look for evidence of occult heart failure.  I have reviewed his past records and note radiofrequency ablation on 09/03/2021.  Echocardiogram on 07/13/2021 showed normal ejection fraction of 60-65% but with grade 1 diastolic dysfunction.  {Document critical care time when appropriate:1} {Document review of labs and  clinical decision tools ie heart score, Chads2Vasc2 etc:1}  {Document your independent review of radiology images, and any outside records:1} {Document your discussion with family members, caretakers, and with consultants:1} {Document social determinants of health affecting pt's care:1} {Document your decision making why or why not admission, treatments were needed:1} Final Clinical Impression(s) / ED Diagnoses Final diagnoses:  None    Rx / DC Orders ED Discharge Orders     None

## 2023-08-08 NOTE — ED Triage Notes (Signed)
Pt POV from home reporting runny nose, congestion, and SOB while that worsens when lying down x3 days. Also endorses chest pressure, slightly labored in triage.

## 2023-08-09 LAB — TROPONIN I (HIGH SENSITIVITY): Troponin I (High Sensitivity): 10 ng/L (ref <18)

## 2023-08-09 LAB — BRAIN NATRIURETIC PEPTIDE: B Natriuretic Peptide: 39 pg/mL (ref 0.0–100.0)

## 2023-08-09 MED ORDER — PREDNISONE 50 MG PO TABS: 50.0000 mg | ORAL_TABLET | Freq: Every day | ORAL | 0 refills | Status: DC

## 2023-08-09 MED ORDER — PREDNISONE 10 MG PO TABS: 60.0000 mg | ORAL_TABLET | Freq: Once | ORAL | Status: DC

## 2023-08-09 MED ORDER — CHLORPROMAZINE HCL 25 MG PO TABS
25.0000 mg | ORAL_TABLET | Freq: Four times a day (QID) | ORAL | 0 refills | Status: DC | PRN
Start: 2023-08-09 — End: 2023-09-25

## 2023-08-09 MED ORDER — ALBUTEROL SULFATE HFA 108 (90 BASE) MCG/ACT IN AERS: 2.0000 | INHALATION_SPRAY | RESPIRATORY_TRACT | 2 refills | Status: AC | PRN

## 2023-08-09 NOTE — Discharge Instructions (Signed)
Use the chlorpromazine every 6 hours as needed to help control your hiccups.  Use the inhaler-2 puffs at a time, every 4-6 hours, as needed for difficulty breathing.  Return to the emergency department if symptoms are getting worse.

## 2023-08-10 ENCOUNTER — Encounter: Payer: Self-pay | Admitting: Physician Assistant

## 2023-08-11 ENCOUNTER — Other Ambulatory Visit: Payer: Self-pay | Admitting: Internal Medicine

## 2023-08-11 DIAGNOSIS — I10 Essential (primary) hypertension: Secondary | ICD-10-CM

## 2023-08-17 ENCOUNTER — Other Ambulatory Visit: Payer: Self-pay | Admitting: Physician Assistant

## 2023-08-17 ENCOUNTER — Other Ambulatory Visit: Payer: Self-pay | Admitting: Internal Medicine

## 2023-08-17 DIAGNOSIS — I2510 Atherosclerotic heart disease of native coronary artery without angina pectoris: Secondary | ICD-10-CM

## 2023-08-17 DIAGNOSIS — I10 Essential (primary) hypertension: Secondary | ICD-10-CM

## 2023-08-17 NOTE — Telephone Encounter (Signed)
Copied from CRM [id number]. Topic: Clinical - Medication Refill >> Aug 17, 2023  3:04 PM Sonny Dandy B wrote: Most Recent Primary Care Visit:  Provider: Dulce Sellar  Department: [facility]  Visit Type: OFFICE VISIT  Date: 06/07/2023  Medication: rosuvastatin (CRESTOR) 40 MG tablet   Has the patient contacted their pharmacy? Yes (Agent: If no, request that the patient contact the pharmacy for the refill. If patient does not wish to contact the pharmacy document the reason why and proceed with request.) (Agent: If yes, when and what did the pharmacy advise?)  Is this the correct pharmacy for this prescription? Yes If no, delete pharmacy and type the correct one.  This is the patient's preferred pharmacy:  [id number] [place], [place] - [address] [place] [address] Phone: [phone] Fax: [phone]     Has the prescription been filled recently? Yes  Is the patient out of the medication? Yes  Has the patient been seen for an appointment in the last year OR does the patient have an upcoming appointment? Yes  Can we respond through MyChart? Yes  Agent: Please be advised that Rx refills may take up to 3 business days. We ask that you follow-up with your pharmacy.

## 2023-09-03 ENCOUNTER — Other Ambulatory Visit: Payer: Self-pay | Admitting: Cardiology

## 2023-09-03 DIAGNOSIS — I10 Essential (primary) hypertension: Secondary | ICD-10-CM

## 2023-09-15 ENCOUNTER — Other Ambulatory Visit: Payer: Self-pay | Admitting: Internal Medicine

## 2023-09-15 DIAGNOSIS — I2510 Atherosclerotic heart disease of native coronary artery without angina pectoris: Secondary | ICD-10-CM

## 2023-09-19 ENCOUNTER — Other Ambulatory Visit: Payer: Self-pay | Admitting: Physician Assistant

## 2023-09-19 DIAGNOSIS — I10 Essential (primary) hypertension: Secondary | ICD-10-CM

## 2023-09-19 NOTE — Telephone Encounter (Signed)
 Copied from CRM [id number]. Topic: Clinical - Medication Refill >> Sep 19, 2023 10:19 AM Theodis Sato wrote: Most Recent Primary Care Visit:  Provider: Dulce Sellar  Department: [facility]  Visit Type: OFFICE VISIT  Date: 06/07/2023  Medication: chlorthalidone (HYGROTON) 25 MG tablet  Has the patient contacted their pharmacy? Yes, pharmacy requested this medication 2/16 but original provider that prescribed the medication was patients heart doctor, but it was denied because patient hasn't been seen there recently, patient is requesting Alyssa Alwardt to fill this medication for him, until he can been seen by the heart specialist again.   (Agent: If no, request that the patient contact the pharmacy for the refill. If patient does not wish to contact the pharmacy document the reason why and proceed with request.) (Agent: If yes, when and what did the pharmacy advise?)  Is this the correct pharmacy for this prescription? Yes If no, delete pharmacy and type the correct one.  This is the patient's preferred pharmacy:  [facility] [place], [place] - [address] [place] [address] Phone: [phone] Fax: [phone]    Has the prescription been filled recently? Yes  Is the patient out of the medication? Yes  Has the patient been seen for an appointment in the last year OR does the patient have an upcoming appointment? Yes   Can we respond through MyChart? No  Agent: Please be advised that Rx refills may take up to 3 business days. We ask that you follow-up with your pharmacy.

## 2023-09-20 ENCOUNTER — Other Ambulatory Visit: Payer: Self-pay | Admitting: Physician Assistant

## 2023-09-20 DIAGNOSIS — I10 Essential (primary) hypertension: Secondary | ICD-10-CM

## 2023-09-21 ENCOUNTER — Other Ambulatory Visit: Payer: Self-pay

## 2023-09-21 ENCOUNTER — Telehealth: Payer: Self-pay

## 2023-09-21 DIAGNOSIS — I10 Essential (primary) hypertension: Secondary | ICD-10-CM

## 2023-09-21 MED ORDER — CHLORTHALIDONE 25 MG PO TABS: 12.5000 mg | ORAL_TABLET | Freq: Every day | ORAL | 0 refills | Status: DC

## 2023-09-21 NOTE — Telephone Encounter (Signed)
 Rx sent per PCP request

## 2023-09-21 NOTE — Telephone Encounter (Signed)
 Copied from CRM [id number]. Topic: Clinical - Prescription Issue >> Sep 20, 2023  3:32 PM Eunice Blase wrote: Reason for CRM: Pt does have appt scheduled for 09/25/2023 for medication refill for  chlorthalidone (HYGROTON) 25 MG tablet. Pt is out of medication and needs medication until appt on 09/25/2023.Please call pt at [phone].  Please see patient message, previously this week was advised unable to refill until appt. Please advise

## 2023-09-22 ENCOUNTER — Other Ambulatory Visit: Payer: Self-pay | Admitting: Cardiology

## 2023-09-22 DIAGNOSIS — I2510 Atherosclerotic heart disease of native coronary artery without angina pectoris: Secondary | ICD-10-CM

## 2023-09-25 ENCOUNTER — Ambulatory Visit (INDEPENDENT_AMBULATORY_CARE_PROVIDER_SITE_OTHER): Admitting: Physician Assistant

## 2023-09-25 ENCOUNTER — Encounter: Payer: Self-pay | Admitting: Physician Assistant

## 2023-09-25 VITALS — BP 118/72 | HR 73 | Temp 98.8°F | Ht 73.0 in | Wt 227.0 lb

## 2023-09-25 DIAGNOSIS — E782 Mixed hyperlipidemia: Secondary | ICD-10-CM

## 2023-09-25 DIAGNOSIS — I2510 Atherosclerotic heart disease of native coronary artery without angina pectoris: Secondary | ICD-10-CM | POA: Diagnosis not present

## 2023-09-25 DIAGNOSIS — I10 Essential (primary) hypertension: Secondary | ICD-10-CM

## 2023-09-25 MED ORDER — CHLORTHALIDONE 25 MG PO TABS
12.5000 mg | ORAL_TABLET | Freq: Every day | ORAL | 1 refills | Status: AC
Start: 2023-09-25 — End: 2023-12-24

## 2023-09-25 MED ORDER — LOSARTAN POTASSIUM 100 MG PO TABS
100.0000 mg | ORAL_TABLET | Freq: Every day | ORAL | 1 refills | Status: AC
Start: 2023-09-25 — End: ?

## 2023-09-25 MED ORDER — ROSUVASTATIN CALCIUM 40 MG PO TABS
40.0000 mg | ORAL_TABLET | Freq: Every day | ORAL | 1 refills | Status: AC
Start: 2023-09-25 — End: ?

## 2023-09-25 NOTE — Progress Notes (Signed)
 Patient ID: Scott Montoya, male    DOB: [birth year], 68 y.o.   MRN: [id number]   Assessment & Plan:  Benign essential hypertension -     Chlorthalidone; Take 0.5 tablets (12.5 mg total) by mouth daily.  Dispense: 90 tablet; Refill: 1 -     Losartan Potassium; Take 1 tablet (100 mg total) by mouth daily.  Dispense: 90 tablet; Refill: 1  Mixed hyperlipidemia -     Rosuvastatin Calcium; Take 1 tablet (40 mg total) by mouth daily.  Dispense: 90 tablet; Refill: 1  Coronary artery calcification -     Rosuvastatin Calcium; Take 1 tablet (40 mg total) by mouth daily.  Dispense: 90 tablet; Refill: 1    Assessment and Plan    Hypertension Well controlled on current regimen of Losartan and Chlorthalidone. -Continue Losartan 100 mg every day and Chlorthalidone 25 mg 1/2 tab qd as prescribed. -Refilled  Hyperlipidemia Last cholesterol panel was in March 2023, patient reports low fat diet and previous low cholesterol levels. -Continue Rosuvastatin 40 mg as prescribed. -Refill Rosuvastatin for 90 days to cover transition period to new healthcare provider in [place]. -Recommend cholesterol panel with new healthcare provider in [place]. (Not-fasting, late afternoon appt today)  General Health Maintenance Patient is moving out of state and will establish care with a new primary care provider in [place]. -Ensure all necessary prescriptions are refilled for 90 days to cover transition period. -No follow-up appointment scheduled due to out-of-state move.         Subjective:    Chief Complaint  Patient presents with   Medication Refill    Pt in office for medication refill; pt wanting refills on all meds at 90 day supply; pt also states he needs labs; he is overdue and he is moving to [place] with his family.     Medication Refill   History of Present Illness   The patient presents for medication refills and follow-up on a previous ER visit.  He is currently taking losartan and  chlorthalidone for blood pressure management, with a dosage of half a tablet of chlorthalidone daily. He takes rosuvastatin for cholesterol management. He follows a low-fat diet and believes his cholesterol levels are well-managed.   In January he visited the emergency room due to persistent hiccups that lasted over two days and disrupted his sleep. Treatment was provided, resolving the issue, although the exact cause was not determined. Blood work at that time was normal.  He mentions a runny nose that persisted for a while but has since resolved. He associates the onset of this symptom with receiving a flu shot.  He is in the process of retiring and moving to [place] to be closer to family, including his son, daughter-in-law, and grandson. He is currently managing logistics related to his move, such as finding a new place to live and transferring his medical care.  No current issues with blood pressure or heart rate, noting that his Fitbit has not indicated any problems.       Past Medical History:  Diagnosis Date   Atrial flutter (HCC)    Basal cell carcinoma 09/09/2020   sup-left upper back (CX35FU)- PCP did bx   Cataract    COPD (chronic obstructive pulmonary disease) (HCC)    Hypertension    OSA (obstructive sleep apnea)     Past Surgical History:  Procedure Laterality Date   A-FLUTTER ABLATION N/A 09/03/2021   Procedure: A-FLUTTER ABLATION;  Surgeon: Marinus Maw, MD;  Location: [facility]  INVASIVE CV LAB;  Service: Cardiovascular;  Laterality: N/A;   CARPAL TUNNEL RELEASE     EYE SURGERY     foot heel repair Bilateral 2013   HEEL SPUR SURGERY     IR FL GUIDED LOC OF NEEDLE/CATH TIP FOR SPINAL INJECTION LT     ORBITAL RECONSTRUCTION  1994   ROTATOR CUFF REPAIR Right 2018    Family History  Problem Relation Age of Onset   Heart attack Father    Cancer Maternal Grandmother    Cancer Maternal Grandfather    Heart attack Paternal Grandfather     Social History   Tobacco  Use   Smoking status: Former    Types: Cigarettes   Smokeless tobacco: Never  Vaping Use   Vaping status: Some Days   Devices: Juul  Substance Use Topics   Alcohol use: Yes    Alcohol/week: 28.0 standard drinks of alcohol    Types: 14 Glasses of wine, 14 Cans of beer per week    Comment: 4 drinks per day   Drug use: Not Currently    Types: Marijuana     Allergies  Allergen Reactions   Codeine Other (See Comments)     dry mouth   Sulfa Antibiotics Nausea And Vomiting    Childhood reaction.    Review of Systems NEGATIVE UNLESS OTHERWISE INDICATED IN HPI      Objective:     BP 118/72 (BP Location: Left Arm, Patient Position: Sitting, Cuff Size: Large)   Pulse 73   Temp 98.8 F (37.1 C) (Temporal)   Ht 6\' 1"  (1.854 m)   Wt 227 lb (103 kg)   SpO2 97%   BMI 29.95 kg/m   Wt Readings from Last 3 Encounters:  09/25/23 227 lb (103 kg)  08/08/23 211 lb (95.7 kg)  06/07/23 211 lb 3.2 oz (95.8 kg)    BP Readings from Last 3 Encounters:  09/25/23 118/72  08/09/23 138/75  06/07/23 128/76     Physical Exam Vitals and nursing note reviewed.  Constitutional:      Appearance: Normal appearance.  Eyes:     Extraocular Movements: Extraocular movements intact.     Conjunctiva/sclera: Conjunctivae normal.     Pupils: Pupils are equal, round, and reactive to light.  Cardiovascular:     Rate and Rhythm: Normal rate and regular rhythm.  Pulmonary:     Effort: Pulmonary effort is normal.     Breath sounds: Normal breath sounds.  Neurological:     General: No focal deficit present.     Mental Status: He is alert and oriented to person, place, and time.  Psychiatric:        Behavior: Behavior normal.        Chanita Boden M Maury Groninger, PA-C

## 2023-10-02 ENCOUNTER — Ambulatory Visit: Admitting: Physician Assistant

## 2023-12-27 ENCOUNTER — Encounter: Payer: Self-pay | Admitting: Pharmacist

## 2023-12-27 NOTE — Progress Notes (Signed)
 Pharmacy Quality Measure Review  This patient is appearing on a report for being at risk of failing the adherence measure for hypertension (ACEi/ARB) medications this calendar year.   Medication: losartan  100mg  Last fill date: 08/07/2023 for 74 day supply  Insurance report was not up to date. Patient filled 90 day supply on 12/20/2023 per Dr Anson Basta Database and picked up the same day at [facility] in [place]   No action needed at this time.   Cecilie Coffee, PharmD Clinical Pharmacist [facility]  Population Health [phone]

## 2024-05-14 NOTE — Progress Notes (Signed)
 Pt declined no longer is a pt of [facility] [place]
